# Patient Record
Sex: Female | Born: 1970
Health system: Southern US, Community
[De-identification: ages and names within clinical notes are randomized; demographics above are authoritative.]

## PROBLEM LIST (undated history)

## (undated) DIAGNOSIS — E509 Vitamin A deficiency, unspecified: Secondary | ICD-10-CM

## (undated) DIAGNOSIS — N7011 Chronic salpingitis: Secondary | ICD-10-CM

## (undated) HISTORY — PX: NO PAST SURGERIES: SHX2092

---

## 2014-04-01 DIAGNOSIS — D649 Anemia, unspecified: Secondary | ICD-10-CM

## 2014-04-01 HISTORY — DX: Anemia, unspecified: D64.9

## 2017-04-25 ENCOUNTER — Encounter: Payer: Self-pay | Admitting: Obstetrics & Gynecology

## 2017-04-25 ENCOUNTER — Ambulatory Visit: Payer: BLUE CROSS/BLUE SHIELD | Admitting: Obstetrics & Gynecology

## 2017-04-25 VITALS — BP 124/82 | Ht 67.25 in | Wt 135.0 lb

## 2017-04-25 DIAGNOSIS — Z3009 Encounter for other general counseling and advice on contraception: Secondary | ICD-10-CM | POA: Diagnosis not present

## 2017-04-25 DIAGNOSIS — Z1151 Encounter for screening for human papillomavirus (HPV): Secondary | ICD-10-CM

## 2017-04-25 DIAGNOSIS — Z01419 Encounter for gynecological examination (general) (routine) without abnormal findings: Secondary | ICD-10-CM | POA: Diagnosis not present

## 2017-04-25 DIAGNOSIS — R87610 Atypical squamous cells of undetermined significance on cytologic smear of cervix (ASC-US): Secondary | ICD-10-CM | POA: Diagnosis not present

## 2017-04-25 DIAGNOSIS — R7309 Other abnormal glucose: Secondary | ICD-10-CM | POA: Diagnosis not present

## 2017-04-25 NOTE — Progress Notes (Signed)
Allison Wood 05-22-1970 [id number]   History:    47 y.o. G1P1L1 Married.  Son is 76 yo, doing well.  Arrived from [place] 7 months ago.  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  RP: New patient preseting for annual gyn exam   HPI: Menses normal every 21 days.  No pelvic pain.  Normal vaginal secretions.  History of left tubal obstruction associated with endometriosis.  Unlikely to conceive, declines contraception, but okay if does conceive.  Breasts normal.  Last screening mammogram 2017.  Would like to do health labs here.  Past medical history,surgical history, family history and social history were all reviewed and documented in the EPIC chart.  Gynecologic History Patient's last menstrual period was 04/19/2017. Contraception: Declines contraception.  Left tubal obstruction/h/o Endometriosis.  [place] if conceives. Last Pap: 2017. Results were: normal Last mammogram: 2017. Results were: normal  Obstetric History OB History  Gravida Para Term Preterm AB Living  _0 SAB TAB Ectopic Multiple Live Births               # Outcome Date GA Lbr Len/2nd Weight Sex Delivery Anes PTL Lv  1 Para                ROS: A ROS was performed and pertinent positives and negatives are included in the history.  GENERAL: No fevers or chills. HEENT: No change in vision, no earache, sore throat or sinus congestion. NECK: No pain or stiffness. CARDIOVASCULAR: No chest pain or pressure. No palpitations. PULMONARY: No shortness of breath, cough or wheeze. GASTROINTESTINAL: No abdominal pain, nausea, vomiting or diarrhea, melena or bright red blood per rectum. GENITOURINARY: No urinary frequency, urgency, hesitancy or dysuria. MUSCULOSKELETAL: No joint or muscle pain, no back pain, no recent trauma. DERMATOLOGIC: No rash, no itching, no lesions. ENDOCRINE: No polyuria, polydipsia, no heat or cold intolerance. No recent change in weight. HEMATOLOGICAL: No anemia or easy bruising or  bleeding. NEUROLOGIC: No headache, seizures, numbness, tingling or weakness. PSYCHIATRIC: No depression, no loss of interest in normal activity or change in sleep pattern.     Exam:   BP 124/82   Ht 5' 7.25" (1.708 m)   Wt 135 lb (61.2 kg)   LMP 04/19/2017   BMI 20.99 kg/m   Body mass index is 20.99 kg/m.  General appearance : Well developed well nourished female. No acute distress HEENT: Eyes: no retinal hemorrhage or exudates,  Neck supple, trachea midline, no carotid bruits, no thyroidmegaly Lungs: Clear to auscultation, no rhonchi or wheezes, or rib retractions  Heart: Regular rate and rhythm, no murmurs or gallops Breast:Examined in sitting and supine position were symmetrical in appearance, no palpable masses or tenderness,  no skin retraction, no nipple inversion, no nipple discharge, no skin discoloration, no axillary or supraclavicular lymphadenopathy Abdomen: no palpable masses or tenderness, no rebound or guarding Extremities: no edema or skin discoloration or tenderness  Pelvic: Vulva normal  Bartholin, Urethra, Skene Glands: Within normal limits             Vagina: No gross lesions or discharge  Cervix: No gross lesions or discharge.  Pap/HR HPV done.  Uterus  AV, normal size, shape and consistency, non-tender and mobile  Adnexa  Without masses or tenderness  Anus and perineum  normal    Assessment/Plan:  47 y.o. female for annual exam   1. Encounter for routine gynecological examination with Papanicolaou smear of cervix Normal  gynecologic exam.  Pap test with high risk HPV done today.  Breast exam normal.  Will schedule screening mammogram.  Follow-up fasting health labs.  Recommend regular aerobic physical activity 5 times a week and weightlifting every 2 days. - CBC - TSH - Vitamin D 1,25 dihydroxy - Comp Met (CMET); Future - Lipid panel; Future - PAP,TP IMGw/HPV RNA,rflx HPVTYPE16,18/45  2. Encounter for other general counseling or advice on  contraception History of left tubal obstruction associated with pelvic endometriosis.  Unlikely to conceive and declines contraception.  At this point if patient conceives will be happy to pursue pregnancy.  3. Special screening examination for human papillomavirus (HPV)  - PAP,TP IMGw/HPV RNA,rflx [id number]/56  [name] MD, 10:57 AM 04/25/2017

## 2017-04-28 ENCOUNTER — Other Ambulatory Visit: Payer: BLUE CROSS/BLUE SHIELD

## 2017-04-28 ENCOUNTER — Encounter: Payer: Self-pay | Admitting: Obstetrics & Gynecology

## 2017-04-28 DIAGNOSIS — Z01419 Encounter for gynecological examination (general) (routine) without abnormal findings: Secondary | ICD-10-CM | POA: Diagnosis not present

## 2017-04-28 DIAGNOSIS — J019 Acute sinusitis, unspecified: Secondary | ICD-10-CM | POA: Diagnosis not present

## 2017-04-28 LAB — LIPID PANEL
CHOLESTEROL: 172 mg/dL (ref <200)
HDL: 61 mg/dL (ref >50)
LDL Cholesterol (Calc): 100 mg/dL (calc) — ABNORMAL HIGH
NON-HDL CHOLESTEROL (CALC): 111 mg/dL (ref <130)
TRIGLYCERIDES: 37 mg/dL (ref <150)
Total CHOL/HDL Ratio: 2.8 (calc) (ref <5.0)

## 2017-04-28 LAB — COMPREHENSIVE METABOLIC PANEL
AG RATIO: 1.5 (calc) (ref 1.0–2.5)
ALKALINE PHOSPHATASE (APISO): 74 U/L (ref 33–115)
ALT: 12 U/L (ref 6–29)
AST: 19 U/L (ref 10–35)
Albumin: 3.9 g/dL (ref 3.6–5.1)
BILIRUBIN TOTAL: 0.4 mg/dL (ref 0.2–1.2)
BUN: 15 mg/dL (ref 7–25)
CALCIUM: 8.5 mg/dL — AB (ref 8.6–10.2)
CHLORIDE: 107 mmol/L (ref 98–110)
CO2: 29 mmol/L (ref 20–32)
Creat: 0.78 mg/dL (ref 0.50–1.10)
GLOBULIN: 2.6 g/dL (ref 1.9–3.7)
Glucose, Bld: 104 mg/dL — ABNORMAL HIGH (ref 65–99)
POTASSIUM: 4 mmol/L (ref 3.5–5.3)
Sodium: 141 mmol/L (ref 135–146)
Total Protein: 6.5 g/dL (ref 6.1–8.1)

## 2017-04-28 NOTE — Patient Instructions (Signed)
1. Encounter for routine gynecological examination with Papanicolaou smear of cervix Normal gynecologic exam.  Pap test with high risk HPV done today.  Breast exam normal.  Will schedule screening mammogram.  Follow-up fasting health labs.  Recommend regular aerobic physical activity 5 times a week and weightlifting every 2 days. - CBC - TSH - Vitamin D 1,25 dihydroxy - Comp Met (CMET); Future - Lipid panel; Future - PAP,TP IMGw/HPV RNA,rflx HPVTYPE16,18/45  2. Encounter for other general counseling or advice on contraception History of left tubal obstruction associated with pelvic endometriosis.  Unlikely to conceive and declines contraception.  At this point if patient conceives will be happy to pursue pregnancy.  3. Special screening examination for human papillomavirus (HPV)  - PAP,TP IMGw/HPV RNA,rflx [id number]/67  Sherie, fue un placer conocerle hoy!  Voy a informarle de sus [employer].    [place]iu (Health Maintenance, Female) Un estilo de vida saludable y los cuidados preventivos pueden favorecer considerablemente a la salud y Musician. Pregunte a su mdico cul es el cronograma de exmenes peridicos apropiado para usted. Esta es una buena oportunidad para consultarlo sobre cmo prevenir enfermedades y [place] sano. Adems de los controles, hay muchas otras cosas que puede hacer usted mismo. Los expertos han realizado numerosas investigaciones [employer] cambios en el estilo de vida y las medidas de prevencin que, [place], lo ayudarn a mantenerse sano. Solicite a su mdico ms informacin. EL PESO Y LA DIETA Consuma una dieta saludable.  Asegrese de [employer] verduras, frutas, productos lcteos de bajo contenido de [place] y Advertising account planner.  No consuma muchos alimentos de alto contenido de grasas slidas, azcares agregados o sal.  Realice actividad fsica con regularidad. Esta es una de las prcticas ms  importantes que puede hacer por su salud. ? La Delorise Shiner de los adultos deben hacer ejercicio durante al menos 162mnutos por semana. El ejercicio debe aumentar la frecuencia cardaca y pActorla transpiracin (ejercicio de i[place]. ? La mayora de los adultos tambin deben hacer ejercicios de elongacin al mToysRusveces a la semana. Agregue esto al su plan de ejercicio de intensidad moderada. Mantenga un peso saludable.  El ndice de masa corporal ([facility] es una medida que puede utilizarse para identificar posibles problemas de pFour [address] Proporciona una estimacin de la grasa corporal basndose en el peso y la altura. Su mdico puede ayudarle a dRadiation protection practitionerIBrisbaney a lScientist, forensico mTheatre managerun peso saludable.  Para las mujeres de 20aos o ms: ? Un IStamford Memorial Hospitalmenor de 18,5 se considera bajo peso. ? Un IKessler Institute For Rehabilitation - Chesterentre 18,5 y 24,9 es normal. ? Un ISoutheasthealth Center Of Stoddard Countyentre 25 y 29,9 se considera sobrepeso. ? Un IMC de 30 o ms se considera obesidad. Observe los niveles de colesterol y lpidos en la sangre.  Debe comenzar a rEnglish as a second language teacherde lpidos y cResearch officer, trade unionen la sangre a los 20aos y luego repetirlos cada 556aos  Es posible que nAutomotive engineerlos niveles de colesterol con mayor frecuencia si: ? Sus niveles de lpidos y colesterol son altos. ? Es mayor de 567MCN ? Presenta un alto riesgo de padecer enfermedades cardacas. DETECCIN DE CNCER Cncer de pulmn  Se recomienda realizar exmenes de deteccin de cncer de pulmn a personas adultas entre 544y 858aos que estn en riesgo de dHorticulturist, commercialde pulmn por sus antecedentes de consumo de tabaco.  Se recomienda una tomografa computarizada de baja dosis de los pulmones todos los aos a las personas que: ?  Fuman actualmente. ? Hayan dejado el hbito en algn momento en los ltimos 15aos. ? Hayan fumado durante 30aos un paquete diario. Un paquete-ao equivale a fumar un promedio de un paquete de cigarrillos diario durante un ao.  Los  exmenes de deteccin anuales deben continuar hasta que hayan pasado 15aos desde que dej de fumar.  Ya no debern realizarse si tiene un problema de salud que le impida recibir tratamiento para Science writer de pulmn. Cncer de mama  Practique la autoconciencia de la mama. Esto significa reconocer la apariencia normal de sus mamas y cmo las siente.  Tambin significa realizar autoexmenes regulares de [employer]. Informe a su mdico sobre cualquier cambio, sin importar cun pequeo sea.  Si tiene entre 20 y 40 aos, un mdico debe realizarle un examen clnico de las mamas como parte del examen regular de [place], cada 1 a 3aos.  Si tiene 40aos o ms, debe Information systems manager clnico de las Microsoft. Tambin considere realizarse una [place]) todos los [place].  Si tiene antecedentes familiares de cncer de mama, hable con su mdico para someterse a un estudio gentico.  Si tiene alto riesgo de Chief Financial Officer de mama, hable con su mdico para someterse a Public house manager y [employer].  La evaluacin del gen del cncer de mama (BRCA) se recomienda a mujeres que tengan familiares con cnceres relacionados con el BRCA. Los cnceres relacionados con el BRCA incluyen los siguientes: ? [place]. ? Ovario. ? [place]. ? Cnceres de peritoneo.  Los resultados de la evaluacin determinarn la necesidad de asesoramiento gentico y de [place] de BRCA1 y BRCA2. Cncer de cuello del tero El mdico puede recomendarle que se haga pruebas peridicas de deteccin de cncer de los rganos de la pelvis (ovarios, tero y vagina). Estas pruebas incluyen un examen plvico, que abarca controlar si se produjeron cambios microscpicos en la superficie del cuello del tero (prueba de Papanicolaou). Pueden recomendarle que se haga estas pruebas cada 3aos, a partir de los 21aos.  A las mujeres que tienen entre 30 y 45aos, los mdicos pueden  recomendarles que se sometan a exmenes plvicos y pruebas de Papanicolaou cada 61aos, o a la prueba de Papanicolaou y el examen plvico en combinacin con estudios de deteccin del virus del papiloma humano (VPH) cada 5aos. Algunos tipos de VPH aumentan el riesgo de Chief Financial Officer de cuello del tero. La prueba para la deteccin del VPH tambin puede realizarse a mujeres de cualquier edad cuyos resultados de la prueba de Papanicolaou no sean claros.  Es posible que otros mdicos no recomienden exmenes de deteccin a mujeres no embarazadas que se consideran sujetos de bajo riesgo de Chief Financial Officer de pelvis y que no tienen sntomas. Pregntele al mdico si un examen plvico de deteccin es adecuado para usted.  Si ha recibido un tratamiento para Science writer cervical o una enfermedad que podra causar cncer, necesitar realizarse una prueba de Papanicolaou y controles durante al menos 70 aos de concluido el [place]. Si no se ha hecho el Papanicolaou con regularidad, debern volver a evaluarse los factores de riesgo (como tener un nuevo compaero sexual), para Teacher, adult education si debe realizarse los estudios nuevamente. Algunas mujeres sufren problemas mdicos que aumentan la probabilidad de Museum/gallery curator cncer de cuello del tero. En estos casos, el mdico podr [employer] se realicen controles y pruebas de Papanicolaou con ms frecuencia. Cncer colorrectal  Este tipo de cncer puede detectarse y a menudo prevenirse.  Por lo general, los estudios de rutina se deben Medical laboratory scientific officer a Field seismologist a Proofreader de los 13 aos y [place] 78 aos.  Sin embargo, el mdico podr aconsejarle que lo haga antes, si tiene factores de riesgo para el cncer de colon.  Tambin puede recomendarle que use un kit de prueba para Hydrologist en la materia fecal.  Es posible que se use una pequea cmara en el extremo de un tubo para examinar directamente el colon (sigmoidoscopia o colonoscopia) a fin de Hydrographic surveyor formas tempranas  de cncer colorrectal.  Los exmenes de rutina generalmente comienzan a los 26aos.  El examen directo del colon se debe repetir cada 5 a 10aos hasta los 75aos. Sin embargo, es posible que se realicen exmenes con mayor frecuencia, si se detectan formas tempranas de plipos precancerosos o pequeos bultos. Cncer de piel  Revise la piel de la cabeza a los pies con regularidad.  Informe a su mdico si aparecen nuevos lunares o los que tiene se modifican, especialmente en su forma y color.  Tambin notifique al mdico si tiene un lunar que es ms grande que el tamao de una goma de lpiz.  Siempre use pantalla solar. Aplique pantalla solar de [place] y repetida a lo largo del Training and development officer.  Protjase usando mangas y [employer], un sombrero de ala ancha y gafas para el sol, siempre que se encuentre en el exterior. ENFERMEDADES CARDACAS, DIABETES E HIPERTENSIN ARTERIAL  La hipertensin arterial causa enfermedades cardacas y [place] el riesgo de ictus. La hipertensin arterial es ms probable en los siguientes casos: ? Las personas que tienen la presin arterial en el extremo del rango normal (100-139/85-89 mm Hg). ? [employer] con sobrepeso u obesidad. ? Scientist, water quality.  Si usted tiene entre 18 y 39 aos, debe medirse la presin arterial cada 3 a 5 aos. Si usted tiene 40 aos o ms, debe medirse la presin arterial [employer]. Debe medirse la presin arterial dos veces: una vez cuando est en un hospital o una clnica y la otra vez cuando est en otro sitio. Registre el promedio de [employer]. Para controlar su presin arterial cuando no est en un hospital o [place], puede usar lo siguiente: ? Jorje Guild automtica para medir la presin arterial en una farmacia. ? Un monitor para medir la presin arterial en el hogar.  Si tiene entre 35 y 45 aos, consulte a su mdico si debe tomar aspirina para prevenir el ictus.  Realcese exmenes de deteccin  de la diabetes con regularidad. Esto incluye la toma de [place] de sangre para controlar el nivel de azcar en la sangre durante el [place]. ? Si tiene un peso normal y un bajo riesgo de padecer diabetes, realcese este anlisis cada tres aos despus de los 45aos. ? Si tiene sobrepeso y un alto riesgo de padecer diabetes, considere someterse a este anlisis antes o con mayor frecuencia. PREVENCIN DE INFECCIONES HepatitisB  Si tiene un riesgo ms alto de Museum/gallery curator hepatitis B, debe someterse a un examen de deteccin de este virus. Se considera que tiene un alto riesgo de contraer hepatitis B si: ? Naci en un pas donde la hepatitis B es frecuente. Pregntele a su mdico qu pases son considerados de Public affairs consultant. ? Sus padres nacieron en un pas de alto riesgo y usted no recibi una vacuna que lo proteja contra la hepatitis B (vacuna contra la hepatitis B). ? [place]. ? [place] agujas para inyectarse  drogas. ? Vive con alguien que tiene hepatitis B. ? Ha tenido sexo con alguien que tiene hepatitis B. ? Recibe tratamiento de hemodilisis. ? Toma ciertos medicamentos para el cncer, trasplante de rganos y afecciones autoinmunitarias. Hepatitis C  Se recomienda un anlisis de [place] para: ? [employer] 1945 y 1965. ? Todas las personas que tengan un riesgo de haber contrado hepatitis C. Enfermedades de transmisin sexual (ETS).  Debe realizarse pruebas de deteccin de enfermedades de transmisin sexual (ETS), incluidas gonorrea y clamidia si: ? Es sexualmente activo y es menor de 09OBS. ? Es mayor de 24aos, y Investment banker, operational informa que corre riesgo de tener este tipo de infecciones. ? La actividad sexual ha cambiado desde que le hicieron la ltima prueba de deteccin y tiene un riesgo mayor de Best boy clamidia o Radio broadcast assistant. Pregntele al mdico si usted tiene riesgo.  Si no tiene el VIH, pero corre riesgo de infectarse por el virus, se recomienda tomar diariamente  un medicamento recetado para evitar la infeccin. Esto se conoce como profilaxis previa a la exposicin. Se considera que est en riesgo si: ? Es [place] sexualmente y no [place] preservativos habitualmente o no conoce el estado del VIH de sus Advertising copywriter. ? Se inyecta drogas. ? Es [place] sexualmente con Ardelia Mems pareja que tiene VIH. Consulte a su mdico para saber si tiene un alto riesgo de infectarse por el VIH. Si opta por comenzar la profilaxis previa a la exposicin, primero debe realizarse anlisis de deteccin del VIH. Luego, le harn anlisis cada 47mses mientras est tomando los medicamentos para la profilaxis previa a la exposicin. [facility] Si es premenopusica y puede quedar e[place]nels[place] solicite a su mdico asesoramiento previo a la concepcin.  Si puede quedar embarazada, tome 400 a [id number](mcg) de cido fAnheuser-Busch  Si desea evitar el embarazo, hable con su mdico sobre el control de la natalidad (anticoncepcin). OSTEOPOROSIS Y MENOPAUSIA  La osteoporosis es una enfermedad en la que los huesos pierden los minerales y la fuerza por el avance de la edad. El resultado pueden ser fracturas graves en los hToaville El riesgo de osteoporosis puede identificarse con uArdelia Memsprueba de densidad sea.  Si tiene 65aos o ms, o si est en riesgo de sufrir osteoporosis y fracturas, pregunte a su mdico si debe someterse a exmenes.  Consulte a su mdico si debe tomar un suplemento de calcio o de vitamina D para reducir el riesgo de osteoporosis.  La menopausia puede presentar ciertos sntomas fsicos y rGaffer  La terapia de reemplazo hormonal puede reducir algunos de estos sntomas y rGaffer Consulte a su mdico para saber si la terapia de reemplazo hormonal es conveniente para usted. INSTRUCCIONES PARA EL CUIDADO EN EL HOGAR  Realcese los estudios de rutina de la salud, dentales y de lPublic librarian  [place]  No consuma ningn producto que  contenga tabaco, lo que incluye cigarrillos, tabaco de mHigher education careers advisero cPsychologist, sport and exercise  Si est embarazada, no beba alcohol.  Si est amamantando, reduzca el consumo de alcohol y la frecuencia con la que consume.  Si es mujer y no est embarazada limite el consumo de alcohol a no ms de 1 medida por da. Una medida equivale a 12onzas de cerveza, 5onzas de vino o 1onzas de bebidas alcohlicas de alta graduacin.  No consuma drogas.  No comparta agujas.  Solicite ayuda a su mdico si necesita apoyo o informacin para abandonar las drogas.  Informe a su  mdico si a menudo se siente deprimido.  Notifique a su mdico si alguna vez ha sido vctima de abuso o si no se siente seguro en su hogar. Esta informacin no tiene Marine scientist el consejo del mdico. Asegrese de hacerle al mdico cualquier pregunta que tenga. Document Released: 03/07/2011 Document Revised: 04/08/2014 Document Reviewed: 12/20/2014 Elsevier Interactive Patient Education  [employer].

## 2017-04-30 LAB — PAP, TP IMAGING W/ HPV RNA, RFLX HPV TYPE 16,18/45: HPV DNA HIGH RISK: NOT DETECTED

## 2017-05-01 LAB — CBC
HCT: 41.1 % (ref 35.0–45.0)
HEMOGLOBIN: 13.5 g/dL (ref 11.7–15.5)
MCH: 27.5 pg (ref 27.0–33.0)
MCHC: 32.8 g/dL (ref 32.0–36.0)
MCV: 83.7 fL (ref 80.0–100.0)
MPV: 10.5 fL (ref 7.5–12.5)
Platelets: 249 10*3/uL (ref 140–400)
RBC: 4.91 10*6/uL (ref 3.80–5.10)
RDW: 12.8 % (ref 11.0–15.0)
WBC: 8.2 10*3/uL (ref 3.8–10.8)

## 2017-05-01 LAB — TEST AUTHORIZATION

## 2017-05-01 LAB — VITAMIN D 1,25 DIHYDROXY
Vitamin D 1, 25 (OH)2 Total: 60 pg/mL (ref 18–72)
Vitamin D3 1, 25 (OH)2: 60 pg/mL

## 2017-05-01 LAB — HEMOGLOBIN A1C
HEMOGLOBIN A1C: 5.4 %{Hb} (ref <5.7)
Mean Plasma Glucose: 108 (calc)
eAG (mmol/L): 6 (calc)

## 2017-05-01 LAB — TSH: TSH: 2.26 m[IU]/L

## 2017-05-20 DIAGNOSIS — B349 Viral infection, unspecified: Secondary | ICD-10-CM | POA: Diagnosis not present

## 2019-10-26 ENCOUNTER — Other Ambulatory Visit: Payer: Self-pay

## 2019-10-26 ENCOUNTER — Encounter: Payer: Self-pay | Admitting: Obstetrics & Gynecology

## 2019-10-26 ENCOUNTER — Ambulatory Visit: Payer: BC Managed Care – PPO | Admitting: Obstetrics & Gynecology

## 2019-10-26 VITALS — BP 118/70 | Ht 67.0 in | Wt 133.0 lb

## 2019-10-26 DIAGNOSIS — N841 Polyp of cervix uteri: Secondary | ICD-10-CM | POA: Diagnosis not present

## 2019-10-26 DIAGNOSIS — Z3009 Encounter for other general counseling and advice on contraception: Secondary | ICD-10-CM

## 2019-10-26 DIAGNOSIS — Z01419 Encounter for gynecological examination (general) (routine) without abnormal findings: Secondary | ICD-10-CM | POA: Diagnosis not present

## 2019-10-26 DIAGNOSIS — N921 Excessive and frequent menstruation with irregular cycle: Secondary | ICD-10-CM | POA: Diagnosis not present

## 2019-10-26 NOTE — Progress Notes (Signed)
Allison Wood [birth year] [id number]   History:    49 y.o. G1P1L1 Married.  Son is 48 yo, doing well.  From [place].  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  RP: Established patient preseting for annual gyn exam   HPI: Menses normal every month.  Had BTB every day this month, since LMP.  No pelvic pain.  Normal vaginal secretions.  History of left tubal obstruction associated with endometriosis.  Unlikely to conceive, declines contraception, but okay if does conceive.  Breasts normal.  Last screening mammogram 2017, will schedule now. Fasting Health Labs with Fam MD.   Past medical history,surgical history, family history and social history were all reviewed and documented in the EPIC chart.  Gynecologic History Patient's last menstrual period was 10/01/2019.  Obstetric History OB History  Gravida Para Term Preterm AB Living  1 1       1   SAB TAB Ectopic Multiple Live Births               # Outcome Date GA Lbr Len/2nd Weight Sex Delivery Anes PTL Lv  1 Para              ROS: A ROS was performed and pertinent positives and negatives are included in the history.  GENERAL: No fevers or chills. HEENT: No change in vision, no earache, sore throat or sinus congestion. NECK: No pain or stiffness. CARDIOVASCULAR: No chest pain or pressure. No palpitations. PULMONARY: No shortness of breath, cough or wheeze. GASTROINTESTINAL: No abdominal pain, nausea, vomiting or diarrhea, melena or bright red blood per rectum. GENITOURINARY: No urinary frequency, urgency, hesitancy or dysuria. MUSCULOSKELETAL: No joint or muscle pain, no back pain, no recent trauma. DERMATOLOGIC: No rash, no itching, no lesions. ENDOCRINE: No polyuria, polydipsia, no heat or cold intolerance. No recent change in weight. HEMATOLOGICAL: No anemia or easy bruising or bleeding. NEUROLOGIC: No headache, seizures, numbness, tingling or weakness. PSYCHIATRIC: No depression, no loss of interest in normal activity  or change in sleep pattern.     Exam:   BP 118/70    Ht 5\' 7"  (1.702 m)    Wt 133 lb (60.3 kg)    LMP 10/01/2019    BMI 20.83 kg/m   Body mass index is 20.83 kg/m.  General appearance : Well developed well nourished female. No acute distress HEENT: Eyes: no retinal hemorrhage or exudates,  Neck supple, trachea midline, no carotid bruits, no thyroidmegaly Lungs: Clear to auscultation, no rhonchi or wheezes, or rib retractions  Heart: Regular rate and rhythm, no murmurs or gallops Breast:Examined in sitting and supine position were symmetrical in appearance, no palpable masses or tenderness,  no skin retraction, no nipple inversion, no nipple discharge, no skin discoloration, no axillary or supraclavicular lymphadenopathy Abdomen: no palpable masses or tenderness, no rebound or guarding Extremities: no edema or skin discoloration or tenderness  Pelvic: Vulva: Normal             Vagina: No gross lesions or discharge  Cervix: Polyp at the External Os.  Pap test done.  Betadine prep. Informed consent obtained. Removal of Polyp with a clamp.  Silver Nitrate on base for hemostasis.  Sent to patho.  Uterus  AV, normal size, shape and consistency, non-tender and mobile  Adnexa  Without masses or tenderness  Anus: Normal   Assessment/Plan:  49 y.o. female for annual exam   1. Encounter for routine gynecological examination with Papanicolaou smear of cervix Normal gynecologic exam.  Pap reflex  done.  Breast exam normal.  Needs to schedule a screening mammogram now.  Health labs with family physician.  Good body mass index at 20.83.  Continue with fitness and healthy nutrition.  2. Encounter for other general counseling or advice on contraception H/O Lt tubal obstruction/Endometriosis.  Declines contraception.  3. Metrorrhagia Metrorrhagia probably due to endocervical polyp removed today.  Rule out endometrial pathology.  Follow-up pelvic ultrasound. - US Transvaginal Non-OB; Future  4.  Endocervical polyp Endocervical polyp removed without difficulty.  Specimen sent to pathology.  Silver nitrate applied to the base.  Good hemostasis.- Pathology Report ([facility])  Other orders - Vitamin D, Ergocalciferol, (DRISDOL) 1.25 MG (50000 UNIT) CAPS capsule; Take 50,000 Units by mouth every 7 (seven) days.  [name] MD, 3:36 PM 10/26/2019

## 2019-10-28 ENCOUNTER — Other Ambulatory Visit: Payer: Self-pay | Admitting: Obstetrics & Gynecology

## 2019-10-28 ENCOUNTER — Encounter: Payer: Self-pay | Admitting: Obstetrics & Gynecology

## 2019-10-28 DIAGNOSIS — Z1231 Encounter for screening mammogram for malignant neoplasm of breast: Secondary | ICD-10-CM

## 2019-10-28 LAB — PATHOLOGY REPORT

## 2019-10-28 LAB — TISSUE SPECIMEN

## 2019-11-02 LAB — PAP IG W/ RFLX HPV ASCU

## 2019-11-02 LAB — HUMAN PAPILLOMAVIRUS, HIGH RISK: HPV DNA High Risk: NOT DETECTED

## 2019-11-10 ENCOUNTER — Ambulatory Visit: Payer: BC Managed Care – PPO

## 2019-11-11 ENCOUNTER — Ambulatory Visit (INDEPENDENT_AMBULATORY_CARE_PROVIDER_SITE_OTHER): Payer: BC Managed Care – PPO | Admitting: Obstetrics & Gynecology

## 2019-11-11 ENCOUNTER — Ambulatory Visit (INDEPENDENT_AMBULATORY_CARE_PROVIDER_SITE_OTHER): Payer: BC Managed Care – PPO

## 2019-11-11 ENCOUNTER — Other Ambulatory Visit: Payer: Self-pay

## 2019-11-11 DIAGNOSIS — R9389 Abnormal findings on diagnostic imaging of other specified body structures: Secondary | ICD-10-CM | POA: Diagnosis not present

## 2019-11-11 DIAGNOSIS — D251 Intramural leiomyoma of uterus: Secondary | ICD-10-CM | POA: Diagnosis not present

## 2019-11-11 DIAGNOSIS — N921 Excessive and frequent menstruation with irregular cycle: Secondary | ICD-10-CM

## 2019-11-11 DIAGNOSIS — N7011 Chronic salpingitis: Secondary | ICD-10-CM

## 2019-11-11 DIAGNOSIS — N840 Polyp of corpus uteri: Secondary | ICD-10-CM

## 2019-11-11 DIAGNOSIS — N854 Malposition of uterus: Secondary | ICD-10-CM | POA: Diagnosis not present

## 2019-11-11 NOTE — Progress Notes (Signed)
    Allison Wood 09/09/1970 [id number]        49 y.o.  G1P1L1 Married  RP: Persistent metrorrhagia for pelvic US  HPI: BTB most days, even after removal of Endocervical Polyp, patho benign.  H/O secondary infertility, HSG Lt tube distal obstruction.  No pelvic pain.   OB History  Gravida Para Term Preterm AB Living  1 1       1   SAB TAB Ectopic Multiple Live Births               # Outcome Date GA Lbr Len/2nd Weight Sex Delivery Anes PTL Lv  1 Para             Past medical history,surgical history, problem list, medications, allergies, family history and social history were all reviewed and documented in the EPIC chart.   Directed ROS with pertinent positives and negatives documented in the history of present illness/assessment and plan.  Exam:  There were no vitals filed for this visit. General appearance:  Normal  Pelvic US today: T/V and T/A images.  Anteverted uterus normal in size and shape with 2 small intramural fibroids.  The overall uterine size is measured at 7.83 x 5.65 x 4.25 cm.  Fluid within the endometrial cavity outlining a 5 mm intracavitary mass towards the right cornua compatible with an endometrial polyp.  The right ovary is small with atrophic appearance.  Large serpiginous mass arising from the left adnexa into the cul-de-sac measuring 12.2 x 9.4 cm compatible with a left hydrosalpinx.  Not able to definitely identify the left ovary separate from the large mass.  No free fluid in the posterior cul-de-sac.   Assessment/Plan:  49 y.o. G1P1   1. Metrorrhagia Persistent metrorrhagia with probable endometrial polyp measured at 5 mm.  Decision to proceed with hysteroscopy with MyoSure excision and D&C.  Pamphlet given.  Information discussed and patient will follow up for a preop visit.  2. Endometrial polyp/Fibroids Schedule HSC/Myosure Excision/D+C  3. Hydrosalpinx Left hydrosalpinx is present on pelvic ultrasound today.  It is quite large but the patient  is asymptomatic.  Known left tubal obstruction from previous hysterosalpingography in the context of secondary infertility.  Counseling done about hydrosalpinx.  Patient prefers observation at this time.   [name] MD, 8:47 AM 11/11/2019

## 2019-11-13 ENCOUNTER — Encounter: Payer: Self-pay | Admitting: Obstetrics & Gynecology

## 2019-11-19 ENCOUNTER — Telehealth: Payer: Self-pay

## 2019-11-19 ENCOUNTER — Encounter: Payer: Self-pay | Admitting: Obstetrics & Gynecology

## 2019-11-19 ENCOUNTER — Other Ambulatory Visit: Payer: Self-pay

## 2019-11-19 ENCOUNTER — Ambulatory Visit: Payer: BC Managed Care – PPO | Admitting: Obstetrics & Gynecology

## 2019-11-19 VITALS — BP 130/82

## 2019-11-19 DIAGNOSIS — N72 Inflammatory disease of cervix uteri: Secondary | ICD-10-CM

## 2019-11-19 DIAGNOSIS — R87610 Atypical squamous cells of undetermined significance on cytologic smear of cervix (ASC-US): Secondary | ICD-10-CM | POA: Diagnosis not present

## 2019-11-19 DIAGNOSIS — N7011 Chronic salpingitis: Secondary | ICD-10-CM

## 2019-11-19 DIAGNOSIS — N840 Polyp of corpus uteri: Secondary | ICD-10-CM | POA: Diagnosis not present

## 2019-11-19 NOTE — Telephone Encounter (Signed)
Allison Wood called patient on my behalf and discussed "Dr. Marguerita Merles sent me new surgery order. I recalculated her surgery prepayment and with her $1750 deductible not yet met and her 20% her prepymt amount is $1915.00 due by one week before surgery to [place]. I will mail a financial letter as well.   The only date I have left in Sept (I need two docs and twice as much time as before) is Wed. Sept 15 at 9:00am at [facility]. She will need to check in two hours early. I will mail a pamphlet from [facility].   Her Covid test will be on Saturday 9/11. I held her time at noon but she can go earlier. Just let me know if she wants to. They only are there until 12:30 on saturdays and that was the latest appointment available. I will mail her address and map to site.   Looks like her pre op appt already scheduled."  Allison Wood spoke with her about all this and patient would like the 9/15 surgery time. I will schedule for her and mail her a packet.

## 2019-11-19 NOTE — Progress Notes (Signed)
    Allison Wood May 14, 1970 [id number]        49 y.o.  G1P1L1  RP: Second ASCUS/HPV HR Negative for Colposcopy  HPI: ASCUS/HPV HR Negative in 2019 and 09/2019.  C/O Left Pelvic Pain.  Known Left large Hydrosalpinx, would like to remove it.   OB History  Gravida Para Term Preterm AB Living  1 1       1   SAB TAB Ectopic Multiple Live Births               # Outcome Date GA Lbr Len/2nd Weight Sex Delivery Anes PTL Lv  1 Para             Past medical history,surgical history, problem list, medications, allergies, family history and social history were all reviewed and documented in the EPIC chart.   Directed ROS with pertinent positives and negatives documented in the history of present illness/assessment and plan.  Exam:  Vitals:   11/19/19 1206  BP: 130/82   General appearance:  Normal  Colposcopy Procedure Note Allison Wood 11/19/2019  Indications:  ASCUS/HPV HR Negative x 2 for Colposcopy  Procedure Details  The risks and benefits of the procedure and Verbal informed consent obtained.  Speculum placed in vagina and excellent visualization of cervix achieved, cervix swabbed x 3 with acetic acid solution.  Findings:  Cervix colposcopy: Physical Exam Genitourinary:       Vaginal colposcopy: Normal  Vulvar colposcopy: Normal  Perirectal colposcopy: Normal  The cervix was sprayed with Hurricane before performing the cervical biopsies.  Specimens:  Cervical Bxs at 4-49-7 O'Clock  Complications:  None, Silver Nitrate for hemostasis . Plan:  Management per cervical Bx results   Assessment/Plan:  49 y.o. G1P1   1. ASCUS of cervix with negative high risk HPV ASCUS with negative high-risk HPV x2.  Colposcopy procedure explained.  Colposcopy findings reviewed with patient.  Cervical biopsies done.  Management per results.  Postprocedure precautions reviewed.  2. Hydrosalpinx Patient requests to proceed with removal of her Left Hydrosalpinx because of  Left Pelvic Pain.  Will add LPS Left Salpingectomy.  Therefore planning LPS Left Salpingectomy with HSC/Myosure Excision/D+C.  F/U Preop visit.  3. Endometrial polyp As above.  Allison Bruins MD, 12:10 PM 11/19/2019

## 2019-11-23 LAB — PATHOLOGY REPORT

## 2019-11-23 LAB — TISSUE PATH REPORT 10802

## 2019-11-25 ENCOUNTER — Ambulatory Visit
Admission: RE | Admit: 2019-11-25 | Discharge: 2019-11-25 | Disposition: A | Discharge status: Home / Self Care | Payer: BC Managed Care – PPO | Source: Ambulatory Visit | Attending: Obstetrics & Gynecology | Admitting: Obstetrics & Gynecology

## 2019-11-25 ENCOUNTER — Other Ambulatory Visit: Payer: Self-pay

## 2019-11-25 DIAGNOSIS — Z1231 Encounter for screening mammogram for malignant neoplasm of breast: Secondary | ICD-10-CM

## 2019-11-29 ENCOUNTER — Other Ambulatory Visit: Payer: Self-pay | Admitting: Obstetrics & Gynecology

## 2019-11-29 DIAGNOSIS — R928 Other abnormal and inconclusive findings on diagnostic imaging of breast: Secondary | ICD-10-CM

## 2019-12-01 ENCOUNTER — Other Ambulatory Visit: Payer: Self-pay

## 2019-12-01 ENCOUNTER — Ambulatory Visit: Payer: BC Managed Care – PPO | Admitting: Obstetrics & Gynecology

## 2019-12-01 ENCOUNTER — Encounter: Payer: Self-pay | Admitting: Obstetrics & Gynecology

## 2019-12-01 ENCOUNTER — Encounter: Payer: Self-pay | Admitting: Anesthesiology

## 2019-12-01 VITALS — BP 120/76

## 2019-12-01 DIAGNOSIS — N840 Polyp of corpus uteri: Secondary | ICD-10-CM

## 2019-12-01 DIAGNOSIS — R9389 Abnormal findings on diagnostic imaging of other specified body structures: Secondary | ICD-10-CM | POA: Diagnosis not present

## 2019-12-01 DIAGNOSIS — N7011 Chronic salpingitis: Secondary | ICD-10-CM

## 2019-12-01 NOTE — Progress Notes (Signed)
    Allison Wood 06/04/1970 [id number]        49 y.o.  G1P1L1  RP: Preop LPS Left Salpingectomy/HSC/Myosure Excision/D+C on 12/15/2019  HPI: Left pelvic pain with large Hydrosalpinx.  Also has a tendency for constipation.  Metrorrhagia with a probable Endometrial Polyp 5 mm.   OB History  Gravida Para Term Preterm AB Living  1 1       1   SAB TAB Ectopic Multiple Live Births               # Outcome Date GA Lbr Len/2nd Weight Sex Delivery Anes PTL Lv  1 Para             Past medical history,surgical history, problem list, medications, allergies, family history and social history were all reviewed and documented in the EPIC chart.   Directed ROS with pertinent positives and negatives documented in the history of present illness/assessment and plan.  Exam:  Vitals:   12/01/19 1239  BP: 120/76   General appearance:  Normal  Pelvic US 11/11/2019: T/V and T/Aimages.Anteverted uterus normal in size and shape with 2 small intramural fibroids. The overall uterine size is measured at 7.83 x 5.65 x 4.25 cm. Fluid within the endometrial cavity outlining a 5 mm intracavitary mass towards the right cornua compatible with an endometrial polyp. The right ovary is small with atrophic appearance. Large serpiginous mass arising from the left adnexa into the cul-de-sac measuring 12.2 x 9.4 cm compatible with a left hydrosalpinx. Not able to definitely identify the left ovary separate from the large mass. No free fluid in the posterior cul-de-sac.   Assessment/Plan:  49 y.o. G1P1   1. Hydrosalpinx Large left Hydrosalpinx with left pelvic pain.  Metrorrhagia with an IU lesion c/w a Polyp 5 mm.  Decision to proceed with LPS Left Salpingectomy/possible lysis of adhesions/ Hysteroscopy with Myosure excision and D+C.  Preop recommendations, surgical procedure and risks, as well as postop precautions and expectations thoroughly reviewed with patient.  Questions answered.  Patient voiced  understanding and agreement with plan.  Tendency for constipation.  Nutritional recommendations to soften her stools reviewed.    2. Endometrial polyp As above.                        Patient was counseled as to the risk of surgery to include the following:  1. Infection (prohylactic antibiotics will be administered)  2. DVT/Pulmonary Embolism (prophylactic pneumo compression stockings will be used)  3.Trauma to internal organs requiring additional surgical procedure to repair any injury to internal organs requiring perhaps additional hospitalization days.  4.Hemmorhage requiring transfusion and blood products which carry risks such as anaphylactic reaction, hepatitis and AIDS  Patient had received literature information on the procedure scheduled and all her questions were answered and fully accepts all risk.   [name] MD, 12:56 PM 12/01/2019

## 2019-12-09 ENCOUNTER — Other Ambulatory Visit: Payer: Self-pay

## 2019-12-09 ENCOUNTER — Encounter (HOSPITAL_BASED_OUTPATIENT_CLINIC_OR_DEPARTMENT_OTHER): Payer: Self-pay | Admitting: Obstetrics & Gynecology

## 2019-12-09 NOTE — Progress Notes (Signed)
Spoke w/ via phone for pre-op interview---pt with pacific interpreter ian # [phone] Lab needs dos---- cbc, urine preg, t & s              Lab results------none COVID test ------12-11-2019 at 1200 pm Arrive at -------700 am 12-15-2019 NPO after MN NO Solid Food.    Water from MN until---600 am then npo Medications to take morning of surgery -----none Diabetic medication -----n/a Patient Special Instructions -----none Pre-Op special Istructions -----none Patient verbalized understanding of instructions that were given at this phone interview. Patient denies shortness of breath, chest pain, fever, cough at this phone interview.  Spanish interpreter requested for day of surgery email confirmation placed on chart

## 2019-12-11 ENCOUNTER — Other Ambulatory Visit (HOSPITAL_COMMUNITY)
Admission: RE | Admit: 2019-12-11 | Discharge: 2019-12-11 | Disposition: A | Discharge status: Home / Self Care | Payer: BC Managed Care – PPO | Source: Ambulatory Visit | Attending: Obstetrics & Gynecology | Admitting: Obstetrics & Gynecology

## 2019-12-11 DIAGNOSIS — Z01812 Encounter for preprocedural laboratory examination: Secondary | ICD-10-CM | POA: Diagnosis not present

## 2019-12-11 DIAGNOSIS — Z20822 Contact with and (suspected) exposure to covid-19: Secondary | ICD-10-CM | POA: Insufficient documentation

## 2019-12-11 LAB — SARS CORONAVIRUS 2 (TAT 6-24 HRS): SARS Coronavirus 2: NEGATIVE

## 2019-12-13 ENCOUNTER — Other Ambulatory Visit: Payer: BC Managed Care – PPO

## 2019-12-14 NOTE — Anesthesia Preprocedure Evaluation (Addendum)
Anesthesia Evaluation  Patient identified by MRN, date of birth, ID band  Reviewed: Allergy & Precautions, NPO status , Patient's Chart, lab work & pertinent test results  Airway Mallampati: I  TM Distance: >3 FB Neck ROM: Full    Dental no notable dental hx.    Pulmonary neg pulmonary ROS,    Pulmonary exam normal breath sounds clear to auscultation       Cardiovascular Exercise Tolerance: Good negative cardio ROS Normal cardiovascular exam Rhythm:Regular Rate:Normal     Neuro/Psych negative neurological ROS  negative psych ROS   GI/Hepatic negative GI ROS, Neg liver ROS,   Endo/Other  negative endocrine ROS  Renal/GU negative Renal ROS  negative genitourinary   Musculoskeletal   Abdominal Normal abdominal exam  (+)   Peds  Hematology  (+) anemia ,   Anesthesia Other Findings hydrosalpinx  Reproductive/Obstetrics                            Anesthesia Physical Anesthesia Plan  ASA: I  Anesthesia Plan: General   Post-op Pain Management:    Induction:   PONV Risk Score and Plan: 3 and Midazolam, Dexamethasone, Ondansetron, Treatment may vary due to age or medical condition and Scopolamine patch - Pre-op  Airway Management Planned: Oral ETT  Additional Equipment:   Intra-op Plan:   Post-operative Plan: Extubation in OR  Informed Consent: I have reviewed the patients History and Physical, chart, labs and discussed the procedure including the risks, benefits and alternatives for the proposed anesthesia with the patient or authorized representative who has indicated his/her understanding and acceptance.     Interpreter used for [employer] and Engineer, production given  Plan Discussed with: Anesthesiologist, CRNA and Surgeon  Anesthesia Plan Comments:        Anesthesia Quick Evaluation

## 2019-12-15 ENCOUNTER — Ambulatory Visit (HOSPITAL_BASED_OUTPATIENT_CLINIC_OR_DEPARTMENT_OTHER): Payer: BC Managed Care – PPO | Admitting: Anesthesiology

## 2019-12-15 ENCOUNTER — Encounter (HOSPITAL_BASED_OUTPATIENT_CLINIC_OR_DEPARTMENT_OTHER)
Admission: RE | Disposition: A | Discharge status: Home / Self Care | Payer: Self-pay | Source: Home / Self Care | Attending: Obstetrics & Gynecology

## 2019-12-15 ENCOUNTER — Other Ambulatory Visit: Payer: Self-pay

## 2019-12-15 ENCOUNTER — Encounter (HOSPITAL_BASED_OUTPATIENT_CLINIC_OR_DEPARTMENT_OTHER): Payer: Self-pay | Admitting: Obstetrics & Gynecology

## 2019-12-15 ENCOUNTER — Ambulatory Visit (HOSPITAL_BASED_OUTPATIENT_CLINIC_OR_DEPARTMENT_OTHER)
Admission: RE | Admit: 2019-12-15 | Discharge: 2019-12-15 | Disposition: A | Discharge status: Home / Self Care | Payer: BC Managed Care – PPO | Attending: Obstetrics & Gynecology | Admitting: Obstetrics & Gynecology

## 2019-12-15 DIAGNOSIS — N802 Endometriosis of fallopian tube: Secondary | ICD-10-CM | POA: Diagnosis not present

## 2019-12-15 DIAGNOSIS — N840 Polyp of corpus uteri: Secondary | ICD-10-CM | POA: Diagnosis present

## 2019-12-15 DIAGNOSIS — N921 Excessive and frequent menstruation with irregular cycle: Secondary | ICD-10-CM | POA: Insufficient documentation

## 2019-12-15 DIAGNOSIS — N7011 Chronic salpingitis: Secondary | ICD-10-CM | POA: Diagnosis present

## 2019-12-15 DIAGNOSIS — N801 Endometriosis of ovary: Secondary | ICD-10-CM | POA: Diagnosis not present

## 2019-12-15 DIAGNOSIS — N83292 Other ovarian cyst, left side: Secondary | ICD-10-CM

## 2019-12-15 DIAGNOSIS — K660 Peritoneal adhesions (postprocedural) (postinfection): Secondary | ICD-10-CM | POA: Diagnosis not present

## 2019-12-15 DIAGNOSIS — N7091 Salpingitis, unspecified: Secondary | ICD-10-CM

## 2019-12-15 HISTORY — DX: Vitamin a deficiency, unspecified: E50.9

## 2019-12-15 HISTORY — DX: Chronic salpingitis: N70.11

## 2019-12-15 HISTORY — PX: LAPAROSCOPIC UNILATERAL SALPINGECTOMY: SHX5934

## 2019-12-15 LAB — ABO/RH: ABO/RH(D): A POS

## 2019-12-15 LAB — CBC
HCT: 35.6 % — ABNORMAL LOW (ref 36.0–46.0)
Hemoglobin: 11.2 g/dL — ABNORMAL LOW (ref 12.0–15.0)
MCH: 27.7 pg (ref 26.0–34.0)
MCHC: 31.5 g/dL (ref 30.0–36.0)
MCV: 87.9 fL (ref 80.0–100.0)
Platelets: 313 10*3/uL (ref 150–400)
RBC: 4.05 MIL/uL (ref 3.87–5.11)
RDW: 14.4 % (ref 11.5–15.5)
WBC: 5.8 10*3/uL (ref 4.0–10.5)
nRBC: 0 % (ref 0.0–0.2)

## 2019-12-15 LAB — TYPE AND SCREEN
ABO/RH(D): A POS
Antibody Screen: NEGATIVE

## 2019-12-15 LAB — POCT PREGNANCY, URINE: Preg Test, Ur: NEGATIVE

## 2019-12-15 SURGERY — SALPINGECTOMY, UNILATERAL, LAPAROSCOPIC
Anesthesia: General | Site: Abdomen

## 2019-12-15 MED ORDER — SILVER NITRATE-POT NITRATE 75-25 % EX MISC
CUTANEOUS | Status: DC | PRN
  Administered 2019-12-15: 3

## 2019-12-15 MED ORDER — BUPIVACAINE HCL (PF) 0.25 % IJ SOLN
INTRAMUSCULAR | Status: DC | PRN
  Administered 2019-12-15: 28 mL

## 2019-12-15 MED ORDER — DEXAMETHASONE SODIUM PHOSPHATE 10 MG/ML IJ SOLN
INTRAMUSCULAR | Status: AC
  Filled 2019-12-15: qty 1

## 2019-12-15 MED ORDER — SCOPOLAMINE 1 MG/3DAYS TD PT72
MEDICATED_PATCH | TRANSDERMAL | Status: AC
  Filled 2019-12-15: qty 1

## 2019-12-15 MED ORDER — ROCURONIUM BROMIDE 10 MG/ML (PF) SYRINGE
PREFILLED_SYRINGE | INTRAVENOUS | Status: DC | PRN
  Administered 2019-12-15: 60 mg via INTRAVENOUS

## 2019-12-15 MED ORDER — MIDAZOLAM HCL 2 MG/2ML IJ SOLN
INTRAMUSCULAR | Status: DC | PRN
  Administered 2019-12-15: 2 mg via INTRAVENOUS

## 2019-12-15 MED ORDER — FENTANYL CITRATE (PF) 100 MCG/2ML IJ SOLN
INTRAMUSCULAR | Status: DC | PRN
Start: 2019-12-15 — End: 2019-12-15
  Administered 2019-12-15 (×2): 50 ug via INTRAVENOUS

## 2019-12-15 MED ORDER — OXYCODONE-ACETAMINOPHEN 7.5-325 MG PO TABS
1.0000 | ORAL_TABLET | Freq: Four times a day (QID) | ORAL | 0 refills | Status: DC | PRN
Start: 2019-12-15 — End: 2020-01-13

## 2019-12-15 MED ORDER — ACETAMINOPHEN 500 MG PO TABS
ORAL_TABLET | ORAL | Status: AC
  Filled 2019-12-15: qty 2

## 2019-12-15 MED ORDER — LIDOCAINE 2% (20 MG/ML) 5 ML SYRINGE
INTRAMUSCULAR | Status: DC | PRN
  Administered 2019-12-15: 80 mg via INTRAVENOUS

## 2019-12-15 MED ORDER — ONDANSETRON HCL 4 MG/2ML IJ SOLN
INTRAMUSCULAR | Status: DC | PRN
  Administered 2019-12-15: 4 mg via INTRAVENOUS

## 2019-12-15 MED ORDER — SUGAMMADEX SODIUM 200 MG/2ML IV SOLN
INTRAVENOUS | Status: DC | PRN
  Administered 2019-12-15: 200 mg via INTRAVENOUS

## 2019-12-15 MED ORDER — PROPOFOL 10 MG/ML IV BOLUS
INTRAVENOUS | Status: DC | PRN
  Administered 2019-12-15: 170 mg via INTRAVENOUS

## 2019-12-15 MED ORDER — FENTANYL CITRATE (PF) 100 MCG/2ML IJ SOLN
INTRAMUSCULAR | Status: AC
  Filled 2019-12-15: qty 2

## 2019-12-15 MED ORDER — ROCURONIUM BROMIDE 10 MG/ML (PF) SYRINGE
PREFILLED_SYRINGE | INTRAVENOUS | Status: AC
  Filled 2019-12-15: qty 10

## 2019-12-15 MED ORDER — PROPOFOL 10 MG/ML IV BOLUS
INTRAVENOUS | Status: AC
  Filled 2019-12-15: qty 20

## 2019-12-15 MED ORDER — ACETAMINOPHEN 500 MG PO TABS
1000.0000 mg | ORAL_TABLET | Freq: Once | ORAL | Status: AC
  Administered 2019-12-15: 1000 mg via ORAL

## 2019-12-15 MED ORDER — FENTANYL CITRATE (PF) 100 MCG/2ML IJ SOLN
25.0000 ug | INTRAMUSCULAR | Status: DC | PRN
  Administered 2019-12-15: 25 ug via INTRAVENOUS

## 2019-12-15 MED ORDER — LIDOCAINE 2% (20 MG/ML) 5 ML SYRINGE
INTRAMUSCULAR | Status: AC
  Filled 2019-12-15: qty 5

## 2019-12-15 MED ORDER — POVIDONE-IODINE 10 % EX SWAB: 2.0000 "application " | Freq: Once | CUTANEOUS | Status: DC

## 2019-12-15 MED ORDER — ONDANSETRON HCL 4 MG/2ML IJ SOLN
INTRAMUSCULAR | Status: AC
  Filled 2019-12-15: qty 2

## 2019-12-15 MED ORDER — CEFAZOLIN SODIUM-DEXTROSE 2-4 GM/100ML-% IV SOLN
INTRAVENOUS | Status: AC
  Filled 2019-12-15: qty 100

## 2019-12-15 MED ORDER — SODIUM CHLORIDE 0.9 % IR SOLN
Status: DC | PRN
  Administered 2019-12-15 (×2): 3000 mL

## 2019-12-15 MED ORDER — KETOROLAC TROMETHAMINE 30 MG/ML IJ SOLN
INTRAMUSCULAR | Status: AC
  Filled 2019-12-15: qty 1

## 2019-12-15 MED ORDER — SCOPOLAMINE 1 MG/3DAYS TD PT72
1.0000 | MEDICATED_PATCH | TRANSDERMAL | Status: DC
  Administered 2019-12-15: 1.5 mg via TRANSDERMAL

## 2019-12-15 MED ORDER — EPHEDRINE SULFATE 50 MG/ML IJ SOLN
INTRAMUSCULAR | Status: DC | PRN
  Administered 2019-12-15: 5 mg via INTRAVENOUS

## 2019-12-15 MED ORDER — KETOROLAC TROMETHAMINE 30 MG/ML IJ SOLN
INTRAMUSCULAR | Status: DC | PRN
  Administered 2019-12-15: 30 mg via INTRAVENOUS

## 2019-12-15 MED ORDER — CEFAZOLIN SODIUM-DEXTROSE 2-4 GM/100ML-% IV SOLN
2.0000 g | INTRAVENOUS | Status: AC
  Administered 2019-12-15: 2 g via INTRAVENOUS

## 2019-12-15 MED ORDER — OXYCODONE HCL 5 MG/5ML PO SOLN: 5.0000 mg | Freq: Once | ORAL | Status: AC | PRN

## 2019-12-15 MED ORDER — OXYCODONE HCL 5 MG PO TABS
ORAL_TABLET | ORAL | Status: AC
  Filled 2019-12-15: qty 1

## 2019-12-15 MED ORDER — OXYCODONE HCL 5 MG PO TABS
5.0000 mg | ORAL_TABLET | Freq: Once | ORAL | Status: AC | PRN
  Administered 2019-12-15: 5 mg via ORAL

## 2019-12-15 MED ORDER — PROMETHAZINE HCL 25 MG/ML IJ SOLN: 6.2500 mg | INTRAMUSCULAR | Status: DC | PRN

## 2019-12-15 MED ORDER — LACTATED RINGERS IV SOLN: INTRAVENOUS | Status: DC

## 2019-12-15 MED ORDER — MIDAZOLAM HCL 2 MG/2ML IJ SOLN
INTRAMUSCULAR | Status: AC
  Filled 2019-12-15: qty 2

## 2019-12-15 MED ORDER — DEXAMETHASONE SODIUM PHOSPHATE 10 MG/ML IJ SOLN
INTRAMUSCULAR | Status: DC | PRN
  Administered 2019-12-15 (×2): 5 mg via INTRAVENOUS

## 2019-12-15 MED ORDER — AMISULPRIDE (ANTIEMETIC) 5 MG/2ML IV SOLN: 10.0000 mg | Freq: Once | INTRAVENOUS | Status: DC | PRN

## 2019-12-15 SURGICAL SUPPLY — 73 items
ADH SKN CLS APL DERMABOND .7 (GAUZE/BANDAGES/DRESSINGS) ×2
APL SRG 38 LTWT LNG FL B (MISCELLANEOUS) ×2
APPLICATOR ARISTA FLEXITIP XL (MISCELLANEOUS) ×3 IMPLANT
BAG LAPAROSCOPIC 12 15 PORT 16 (BASKET) IMPLANT
BAG RETRIEVAL 10 (BASKET)
BAG RETRIEVAL 12/15 (BASKET)
BAG SPEC RTRVL LRG 6X4 10 (ENDOMECHANICALS) ×4
BARRIER ADHS 3X4 INTERCEED (GAUZE/BANDAGES/DRESSINGS) IMPLANT
BRR ADH 4X3 ABS CNTRL BYND (GAUZE/BANDAGES/DRESSINGS)
CABLE HIGH FREQUENCY MONO STRZ (ELECTRODE) ×3 IMPLANT
CATH ROBINSON RED A/P 16FR (CATHETERS) IMPLANT
CNTNR URN SCR LID CUP LEK RST (MISCELLANEOUS) IMPLANT
CONT SPEC 4OZ STRL OR WHT (MISCELLANEOUS)
COVER MAYO STAND STRL (DRAPES) ×3 IMPLANT
COVER WAND RF STERILE (DRAPES) ×3 IMPLANT
DERMABOND ADVANCED (GAUZE/BANDAGES/DRESSINGS) ×1
DERMABOND ADVANCED .7 DNX12 (GAUZE/BANDAGES/DRESSINGS) ×2 IMPLANT
DEVICE MYOSURE LITE (MISCELLANEOUS) IMPLANT
DEVICE MYOSURE REACH (MISCELLANEOUS) IMPLANT
DILATOR CANAL MILEX (MISCELLANEOUS) IMPLANT
DRSG COVADERM PLUS 2X2 (GAUZE/BANDAGES/DRESSINGS) IMPLANT
DRSG OPSITE POSTOP 3X4 (GAUZE/BANDAGES/DRESSINGS) IMPLANT
DURAPREP 26ML APPLICATOR (WOUND CARE) ×3 IMPLANT
ELECT REM PT RETURN 9FT ADLT (ELECTROSURGICAL) ×3
ELECTRODE REM PT RTRN 9FT ADLT (ELECTROSURGICAL) ×2 IMPLANT
GAUZE 4X4 16PLY RFD (DISPOSABLE) ×3 IMPLANT
GLOVE BIO SURGEON STRL SZ 6.5 (GLOVE) ×3 IMPLANT
GLOVE BIO SURGEON STRL SZ8 (GLOVE) ×6 IMPLANT
GLOVE BIOGEL PI IND STRL 7.0 (GLOVE) ×6 IMPLANT
GLOVE BIOGEL PI INDICATOR 7.0 (GLOVE) ×3
GOWN STRL REUS W/TWL LRG LVL3 (GOWN DISPOSABLE) ×6 IMPLANT
GOWN STRL REUS W/TWL XL LVL3 (GOWN DISPOSABLE) ×3 IMPLANT
HEMOSTAT ARISTA ABSORB 3G PWDR (HEMOSTASIS) ×3 IMPLANT
IV NS IRRIG 3000ML ARTHROMATIC (IV SOLUTION) ×6 IMPLANT
KIT PROCEDURE FLUENT (KITS) IMPLANT
LIGASURE VESSEL 5MM BLUNT TIP (ELECTROSURGICAL) ×3 IMPLANT
MANIPULATOR UTERINE 4.5 ZUMI (MISCELLANEOUS) IMPLANT
MYOSURE XL FIBROID (MISCELLANEOUS)
NDL SAFETY ECLIPSE 18X1.5 (NEEDLE) IMPLANT
NEEDLE HYPO 18GX1.5 SHARP (NEEDLE)
PACK LAPAROSCOPY BASIN (CUSTOM PROCEDURE TRAY) ×3 IMPLANT
PACK TRENDGUARD 450 HYBRID PRO (MISCELLANEOUS) ×2 IMPLANT
PACK VAGINAL MINOR WOMEN LF (CUSTOM PROCEDURE TRAY) ×3 IMPLANT
PAD OB MATERNITY 4.3X12.25 (PERSONAL CARE ITEMS) ×3 IMPLANT
PAD PREP 24X48 CUFFED NSTRL (MISCELLANEOUS) ×3 IMPLANT
POUCH SPECIMEN RETRIEVAL 10MM (ENDOMECHANICALS) ×6 IMPLANT
PROTECTOR NERVE ULNAR (MISCELLANEOUS) IMPLANT
SCISSORS LAP 5X35 DISP (ENDOMECHANICALS) IMPLANT
SEAL CERVICAL OMNI LOK (ABLATOR) IMPLANT
SEAL ROD LENS SCOPE MYOSURE (ABLATOR) ×3 IMPLANT
SET SUCTION IRRIG HYDROSURG (IRRIGATION / IRRIGATOR) ×3 IMPLANT
SET TUBE SMOKE EVAC HIGH FLOW (TUBING) ×3 IMPLANT
SOLUTION ELECTROLUBE (MISCELLANEOUS) IMPLANT
SUT MNCRL AB 4-0 PS2 18 (SUTURE) ×6 IMPLANT
SUT VICRYL 0 UR6 27IN ABS (SUTURE) ×3 IMPLANT
SYR 50ML LL SCALE MARK (SYRINGE) IMPLANT
SYS BAG RETRIEVAL 10MM (BASKET)
SYS RETRIEVAL 5MM INZII UNIV (BASKET)
SYSTEM BAG RETRIEVAL 10MM (BASKET) IMPLANT
SYSTEM RETRIEVL 5MM INZII UNIV (BASKET) IMPLANT
SYSTEM TISS REMOVAL MYOSURE XL (MISCELLANEOUS) IMPLANT
TOWEL OR 17X26 10 PK STRL BLUE (TOWEL DISPOSABLE) ×3 IMPLANT
TRAP SPECIMEN MUCUS 40CC (MISCELLANEOUS) ×3 IMPLANT
TRAY FOLEY W/BAG SLVR 14FR LF (SET/KITS/TRAYS/PACK) ×3 IMPLANT
TRENDGUARD 450 HYBRID PRO PACK (MISCELLANEOUS) ×3
TROCAR 12M 150ML BLUNT (TROCAR) IMPLANT
TROCAR BALLN 12MMX100 BLUNT (TROCAR) ×3 IMPLANT
TROCAR BLADELESS 15MM (ENDOMECHANICALS) IMPLANT
TROCAR BLADELESS OPT 12M 100M (ENDOMECHANICALS) IMPLANT
TROCAR BLADELESS OPT 5 100 (ENDOMECHANICALS) ×6 IMPLANT
TROCAR XCEL DIL TIP R 11M (ENDOMECHANICALS) IMPLANT
TROCAR XCEL NON-BLD 11X100MML (ENDOMECHANICALS) IMPLANT
WARMER LAPAROSCOPE (MISCELLANEOUS) ×3 IMPLANT

## 2019-12-15 NOTE — H&P (Signed)
Allison Wood is an 49 y.o. female.G1P1L1  RP: LPS Left Salpingectomy/HSC/Myosure Excision/D+C  HPI:  No change x last visit. Left pelvic pain with large Hydrosalpinx.  Also has a tendency for constipation.  Metrorrhagia with a probable Endometrial Polyp 5 mm.  Menstrual History: Patient's last menstrual period was 11/08/2019.    Past Medical History:  Diagnosis Date  . Anemia 2016   none since  . Hydrosalpinx   . Vitamin A deficiency     Past Surgical History:  Procedure Laterality Date  . NO PAST SURGERIES      Family History  Problem Relation Age of Onset  . Diabetes Father   . Cancer Father        lung     Social History:  reports that she has never smoked. She has never used smokeless tobacco. She reports previous alcohol use. She reports that she does not use drugs.  Allergies: No Known Allergies  Medications Prior to Admission  Medication Sig Dispense Refill Last Dose  . Vitamin D, Ergocalciferol, (DRISDOL) 1.25 MG (50000 UNIT) CAPS capsule Take 50,000 Units by mouth every 7 (seven) days. tuesday   12/14/2019 at Unknown time    REVIEW OF SYSTEMS: A ROS was performed and pertinent positives and negatives are included in the history.  GENERAL: No fevers or chills. HEENT: No change in vision, no earache, sore throat or sinus congestion. NECK: No pain or stiffness. CARDIOVASCULAR: No chest pain or pressure. No palpitations. PULMONARY: No shortness of breath, cough or wheeze. GASTROINTESTINAL: No abdominal pain, nausea, vomiting or diarrhea, melena or bright red blood per rectum. GENITOURINARY: No urinary frequency, urgency, hesitancy or dysuria. MUSCULOSKELETAL: No joint or muscle pain, no back pain, no recent trauma. DERMATOLOGIC: No rash, no itching, no lesions. ENDOCRINE: No polyuria, polydipsia, no heat or cold intolerance. No recent change in weight. HEMATOLOGICAL: No anemia or easy bruising or bleeding. NEUROLOGIC: No headache, seizures, numbness, tingling or  weakness. PSYCHIATRIC: No depression, no loss of interest in normal activity or change in sleep pattern.     Blood pressure 118/79, temperature 98.9 F (37.2 C), resp. rate 14, height 5\' 7"  (1.702 m), weight 64.5 kg, last menstrual period 11/08/2019, SpO2 100 %.  Physical Exam:  See office notes   Results for orders placed or performed during the hospital encounter of 12/15/19 (from the past 24 hour(s))  Pregnancy, urine POC     Status: None   Collection Time: 12/15/19  7:39 AM  Result Value Ref Range   Preg Test, Ur NEGATIVE NEGATIVE   Covid Negative  Pelvic US 11/11/2019: T/V and T/Aimages.Anteverted uterus normal in size and shape with 2 small intramural fibroids. The overall uterine size is measured at 7.83 x 5.65 x 4.25 cm. Fluid within the endometrial cavity outlining a 5 mm intracavitary mass towards the right cornua compatible with an endometrial polyp. The right ovary is small with atrophic appearance. Large serpiginous mass arising from the left adnexa into the cul-de-sac measuring 12.2 x 9.4 cm compatible with a left hydrosalpinx. Not able to definitely identify the left ovary separate from the large mass. No free fluid in the posterior cul-de-sac.   Assessment/Plan:  49 y.o. G1P1   1. Hydrosalpinx Large left Hydrosalpinx with left pelvic pain.  Metrorrhagia with an IU lesion c/w a Polyp 5 mm.  Decision to proceed with LPS Left Salpingectomy/possible lysis of adhesions/ Hysteroscopy with Myosure excision and D+C.  Preop recommendations, surgical procedure and risks, as well as postop precautions and expectations thoroughly reviewed  with patient.  Questions answered.  Patient voiced understanding and agreement with plan.   2. Endometrial polyp As above.                        Patient was counseled as to the risk of surgery to include the following:  1. Infection (prohylactic antibiotics will be administered)  2. DVT/Pulmonary Embolism (prophylactic pneumo  compression stockings will be used)  3.Trauma to internal organs requiring additional surgical procedure to repair any injury to internal organs requiring perhaps additional hospitalization days.  4.Hemmorhage requiring transfusion and blood products which carry risks such as anaphylactic reaction, hepatitis and AIDS  Patient had received literature information on the procedure scheduled and all her questions were answered and fully accepts all risk.   Marie-Lyne Delmore Sear 12/15/2019, 8:03 AM

## 2019-12-15 NOTE — Op Note (Signed)
Operative Note  12/15/2019  1:03 PM  PATIENT:  Allison Wood  49 y.o. female  PRE-OPERATIVE DIAGNOSIS:  Left large hydrosalpinx, left sided pelvic pain, endometrial polyp  POST-OPERATIVE DIAGNOSIS:  Left large ovarian cyst with severe adhesions  PROCEDURE:  Procedure(s): LAPAROSCOPIC LEFT SALPINGOOPHORECTOMY, LEFT OVARIAN CYSTECTOMY, EXTENSIVE LYSIS OF ADHESIONS AND PERITONEAL WASHINGS  SURGEON:  Surgeon(s): [name], MD Joseph Pierini, MD  ANESTHESIA:   general  FINDINGS: Large complex left ovarian cyst with severe adhesions of the left ovary with the omentum, sigmoid and left ovarian fossa.  DESCRIPTION OF OPERATION: Under general anesthesia with endotracheal intubation the patient is in lithotomy position.  She is prepped with DuraPrep on the abdomen and with Betadine on the suprapubic, vulvar and vaginal areas.  She is draped as usual.  The Foley is inserted in the bladder.  Timeout is done.  The vaginal exam reveals a very large mass that reaches just below the umbilicus on the left side.  The speculum is inserted in the vagina.  The uterine cannula is inserted with a tenaculum on the anterior lip of the cervix.  The speculum is removed.  We go to the abdomen.  The supraumbilical area is infiltrated with Marcaine one quarter plain.  A 1.5 cm incision is done with the scalpel at that level.  The aponeurosis is grasped with Kocher clamps.  The aponeurosis is opened under direct vision with the Mayo scissors.  The parietal peritoneum is also open with Mayo scissors under direct vision.  A pursestring stitch of Vicryl 0 is done at the aponeurosis.  The Sheryle Hail is inserted under direct vision.  A pneumoperitoneum is created.  The camera is inserted for inspection of the abdominopelvic cavities.  Inspection reveals a large about 12 to 14 cm left ovarian cyst adherent to the left ovarian fossa, the posterior pelvis and to the omentum.  The surface of the cyst is benign in appearance.   No ascites present.  The right ovary and tube are normal.  The uterus is normal in size and appearance.  No other pathology in the abdomen or pelvis.  5 mm incisions are done after infiltrating Marcaine one quarter plain at the right and left lower abdomen.  5 mm ports are inserted under direct vision.  We start with peritoneal washings.  We then proceeded to perform a left ovarian cystectomy.  The omental adhesions were lysed.  We used atraumatic clamps and the hot scissors.  During the process the cyst ruptured. Solid areas were present within the cyst which raised the possibility that it was not a benign process.  We used the Endobag to put the ovarian cyst wall in and removed it through the [place] using a 5 mm camera and the assistant port.  The cyst wall with the solid portions were sent for fresh frozen section.  Given that this would give [place] an idea but would not be the final pathology, we decided to proceed with a left salpingo-oophorectomy.  The left ureter was seen in normal anatomic position with good peristalsis.  We used the harmonic to cauterize and section the left infundibulopelvic ligament.  We cauterized and sectioned below the left ovary and tube with the harmonic.  We then proceeded with meticulous lysis of adhesion between the left ovary and the sigmoid, as well as with the left ovarian fossa.  We cauterized and sectioned the left tube proximally.  We cauterized and sectioned the left utero-ovarian ligament.  We finally meticulously lysed the adhesions between  the left ovary and the sigmoid staying on the ovary to avoid any trauma to the bowels.  Hemostasis was adequate at all levels.  We used an Endobag to produce a specimen in with a 5 mm camera and the assistant port.  The left ovary and tube were removed and sent to pathology.  We irrigated and suction the abdominopelvic cavities.  Hemostasis was confirmed once more.  We had seen the uterine cannula perforated the uterus during the surgery.   Hemostasis was adequate at that level.  That prevented [place] from proceeding with a hysteroscopy. The very small 5 mm possible polyp will be reassessed by pelvic ultrasound within the next 3 months.  All laparoscopic instruments were removed under direct vision.  All ports were removed under direct vision.  The CO2 was evacuated.  The supraumbilical incision was closed by first attaching the pursestring stitch.  We then closed the 3 skin incisions with a Vicryl 4-0 in a subcuticular suture.  Dermabond was added on the 3 incisions.  The cannula and tenaculum were removed.  Hemostasis was adequate vaginally.  The patient was brought to recovery room in good and stable status.  ESTIMATED BLOOD LOSS: 50 mL   Intake/Output Summary (Last 24 hours) at 12/15/2019 1303 Last data filed at 12/15/2019 1206 Gross per 24 hour  Intake 1600 ml  Output 250 ml  Net 1350 ml     BLOOD ADMINISTERED:none   LOCAL MEDICATIONS USED:  MARCAINE     SPECIMEN:  Source of Specimen:  Left Ovary and Tube/Peritoneal washings  DISPOSITION OF SPECIMEN: Fresh Frozen and definitive  PATHOLOGY  COUNTS:  YES  PLAN OF CARE: Transfer to PACU  Marie-Lyne LavoieMD1:03 PM

## 2019-12-15 NOTE — Transfer of Care (Signed)
Immediate Anesthesia Transfer of Care Note  Patient: Allison Wood  Procedure(s) Performed: Procedure(s) (LRB): LAPAROSCOPIC; LEFT SALPINGOOPHORECTOMY, LEFT OVARIAN CYSTECTOMY, EXTENSIVE LYSIS OF ADHESIONS AND PERITONEAL WASHINGS (Left)  Patient Location: PACU  Anesthesia Type: General  Level of Consciousness: awake, oriented, sedated and patient cooperative  Airway & Oxygen Therapy: Patient Spontanous Breathing and Patient connected to face mask oxygen  Post-op Assessment: Report given to PACU RN and Post -op Vital signs reviewed and stable  Post vital signs: Reviewed and stable  Complications: No apparent anesthesia complications  Last Vitals:  Vitals Value Taken Time  BP 118/83 12/15/19 1200  Temp    Pulse 74 12/15/19 1206  Resp 18 12/15/19 1206  SpO2 100 % 12/15/19 1206  Vitals shown include unvalidated device data.  Last Pain:  Vitals:   12/15/19 0754  PainSc: 2       Patients Stated Pain Goal: 4 (93/96/88 6484)  Complications: No complications documented.

## 2019-12-15 NOTE — Anesthesia Postprocedure Evaluation (Signed)
Anesthesia Post Note  Patient: Allison Wood  Procedure(s) Performed: LAPAROSCOPIC; LEFT SALPINGOOPHORECTOMY, LEFT OVARIAN CYSTECTOMY, EXTENSIVE LYSIS OF ADHESIONS AND PERITONEAL WASHINGS (Left Abdomen)     Patient location during evaluation: PACU Anesthesia Type: General Level of consciousness: awake and alert Pain management: pain level controlled Vital Signs Assessment: post-procedure vital signs reviewed and stable Respiratory status: spontaneous breathing, nonlabored ventilation, respiratory function stable and patient connected to nasal cannula oxygen Cardiovascular status: blood pressure returned to baseline and stable Postop Assessment: no apparent nausea or vomiting Anesthetic complications: no   No complications documented.  Last Vitals:  Vitals:   12/15/19 1230 12/15/19 1245  BP: 122/77   Pulse: 64 65  Resp: 14 13  Temp:    SpO2: 100% 100%    Last Pain:  Vitals:   12/15/19 1230  PainSc: [place]ellamarie Randa

## 2019-12-15 NOTE — Anesthesia Procedure Notes (Signed)
Procedure Name: Intubation Date/Time: 12/15/2019 8:59 AM Performed by: Suan Halter, CRNA Pre-anesthesia Checklist: Patient identified, Emergency Drugs available, Suction available and Patient being monitored Patient Re-evaluated:Patient Re-evaluated prior to induction Oxygen Delivery Method: Circle system utilized Preoxygenation: Pre-oxygenation with 100% oxygen Induction Type: IV induction Ventilation: Mask ventilation without difficulty Laryngoscope Size: Mac and 3 Grade View: Grade I Tube type: Oral Tube size: 7.0 mm Number of attempts: 1 Airway Equipment and Method: Stylet and Oral airway Placement Confirmation: ETT inserted through vocal cords under direct vision,  positive ETCO2 and breath sounds checked- equal and bilateral Secured at: 22 cm Tube secured with: Tape Dental Injury: Teeth and Oropharynx as per pre-operative assessment

## 2019-12-15 NOTE — Discharge Instructions (Signed)
Salpingooforectoma unilateral, cuidados posteriores Unilateral Salpingo-Oophorectomy, Care After Lea esta informacin sobre cmo cuidarse despus del procedimiento. Su mdico tambin podr darle indicaciones ms especficas. Comunquese con su mdico si tiene problemas o preguntas. Qu puedo esperar despus del procedimiento? Despus del procedimiento, es comn [employer] siguientes sntomas:  Dolor abdominal.  Algn sangrado vaginal ocasional (pequeas manchas).  Cansancio. Siga estas indicaciones en su casa: Cuidados de la incisin   Mantenga la zona de la incisin y el vendaje limpios y secos.  Siga las indicaciones del mdico acerca del cuidado de la incisin. Haga lo siguiente: ? Lvese las manos con agua y [place] antes de cambiar el vendaje. Use desinfectante para manos si no dispone de [place] y [place]. ? Cambie el vendaje como se lo haya indicado el mdico. ? No retire los puntos (suturas), las grapas, la goma para cerrar la piel o las tiras [place]. Es posible que estos cierres cutneos Animal nutritionist en la piel durante 2semanas o ms tiempo. Si los bordes de las tiras adhesivas empiezan a despegarse y Therapist, sports, puede recortar los que estn sueltos. No retire las tiras [facility] por completo a menos que el mdico se lo indique.  Sarepta zona de la incisin para detectar signos de infeccin. Est atento a los siguientes signos: ? Dolor, hinchazn o enrojecimiento. ? Lquido o sangre. ? Calor. ? Pus o mal olor. Actividad  No conduzca ni use maquinaria pesada mientras toma analgsicos recetados.  No conduzca durante 24horas si le administraron un medicamento para ayudarlo a que se relaje (sedante).  Haga caminatas cortas y frecuentes Agricultural consultant. Descanse cuando se sienta cansada. Pregntele al mdico qu actividades son seguras para usted.  Evite actividades que requieran un gran esfuerzo. Tambin, evite levantar pesos [employer]. No levante ningn  objeto que pese ms de 5libras (2,3kg) o el lmite de peso que le indique su mdico hasta que este le diga que puede [place].  No se haga duchas vaginales, no use tampones ni tenga relaciones sexuales hasta que su mdico lo autorice. Instrucciones generales  A fin de prevenir o tratar el estreimiento mientras toma analgsicos recetados, el mdico puede recomendarle lo siguiente: ? Beber suficiente lquido para mantener la orina de color amarillo plido. ? Tomar medicamentos recetados o de [employer]. ? Consumir alimentos ricos en fibra, como frutas y verduras frescas, cereales integrales y frijoles. ? Limitar el consumo de alimentos ricos en grasas y azcares procesados, como alimentos fritos o dulces.  Tome los medicamentos de venta libre y los recetados solamente como se lo haya indicado el mdico.  No tome baos de inmersin, no nade ni use el jacuzzi hasta que el mdico lo autorice. Pregntele al mdico si puede ducharse. Thurston Pounds solo le permitan darse baos de [place].  Use las medias de compresin como se lo haya indicado el mdico. Estas medias ayudan a Mining engineer formacin de cogulos de [place] y a reducir la hinchazn de las piernas.  Concurra a todas las visitas de control como se lo haya indicado el mdico. Esto es importante. Comunquese con un mdico si:  Siente dolor al [employer].  Tiene una secrecin con feo olor o pus que proviene de la vagina.  Tiene enrojecimiento, hinchazn o dolor alrededor de la incisin.  Le sale lquido o sangre de la incisin.  La incisin est caliente al tacto.  Tiene pus o percibe que sale mal olor del lugar de la incisin.  Tiene fiebre.  La incisin comienza a abrirse.  Tiene un dolor abdominal que empeora o que no mejora con los medicamentos.  Le aparece una erupcin cutnea.  Presenta nuseas o vmitos.  Se siente mareado. Solicite ayuda de inmediato si:  Tree surgeon en el pecho o en la pierna.  Le falta el aire.  Se  desmaya.  Observa un aumento de la hemorragia vaginal. Resumen  Despus del procedimiento, es comn Patent attorney, cansancio y sangrado ocasional de la vagina.  Siga las indicaciones del mdico acerca del cuidado de la incisin.  Controle la incisin todos los das para detectar signos de infeccin e informe sobre cualquier sntoma a su mdico.  Siga las indicaciones del mdico respecto de las actividades y Futures trader. Esta informacin no tiene Marine scientist el consejo del mdico. Asegrese de hacerle al mdico cualquier pregunta que tenga. Document Revised: 10/22/2016 Document Reviewed: 10/22/2016 Elsevier Patient Education  [place] de diagnstico Diagnostic Laparoscopy La laparoscopa de diagnstico es un procedimiento que se realiza para diagnosticar enfermedades en el abdomen. Podra realizarse por diversos motivos, como buscar tejido cicatricial, cncer o la razn de un dolor en el abdomen (abdominal). Durante el procedimiento, se inserta un tubo delgado y flexible que tiene una luz y una cmara en el extremo (laparoscopio) a Lawerance Cruel de una incisin en el abdomen. La imagen de la cmara se [place] en un monitor para ayudar al cirujano a ver dentro del cuerpo. Informe al mdico acerca de lo siguiente:  Cualquier alergia que tenga.  Todos los [employer], incluidos vitaminas, hierbas, gotas oftlmicas, cremas y medicamentos de venta libre.  Cualquier problema que usted o sus familiares hayan tenido con anestsicos.  Cualquier enfermedad de la sangre que tenga.  Cirugas a las que se someti.  Cualquier afeccin mdica que tenga. Cules son los riesgos? En general, se trata de un procedimiento seguro. Sin embargo, pueden ocurrir complicaciones, por ejemplo:  Infeccin.  Hemorragia.  Reacciones alrgicas a los medicamentos o a los tintes.  Lesiones en estructuras u rganos abdominales, como los intestinos, el hgado, el estmago o  el bazo. Qu ocurre antes del procedimiento? Medicamentos  Consulte al mdico sobre: ? Quarry manager o suspender los medicamentos que toma habitualmente. Esto es muy importante si toma medicamentos para la diabetes o anticoagulantes. ? Tomar medicamentos como aspirina e ibuprofeno. Estos medicamentos pueden tener un efecto anticoagulante en la [place]. No tome estos medicamentos a menos que el mdico se lo indique. ? Tomar medicamentos de [employer], vitaminas, hierbas y suplementos.  Pueden indicarle un antibitico para ayudar a prevenir infecciones. Mantenerse hidratado Siga las indicaciones del mdico acerca de mantenerse hidratado, las cuales pueden incluir lo siguiente:  Hasta 2horas antes del procedimiento, puede beber lquidos transparentes, como agua, jugos de fruta sin pulpa, caf negro y t solo. Restricciones en las comidas y bebidas Siga las indicaciones del mdico respecto de las restricciones de comidas o bebidas, las cuales pueden incluir lo siguiente:  Ocho horas antes del procedimiento, deje de ingerir comidas o alimentos difciles de digerir, como carne, alimentos fritos o alimentos con alto contenido en grasas.  Seis horas antes del procedimiento, deje de ingerir comidas o alimentos fciles de digerir, como tostadas o cereales.  Seis horas antes del procedimiento, deje de beber [place] o bebidas que [employer].  Dos horas antes del procedimiento, deje de beber lquidos transparentes. Instrucciones generales  Pregntele al mdico cmo se marcar o se Museum/gallery curator de la ciruga.  Es posible que le indiquen que se bae con un [place]  antisptico.  Haga que alguien lo lleve a su casa desde el hospital o la clnica.  Pdale a un adulto responsable que lo cuide durante al menos 24horas despus de que le den el alta del hospital o de la [place]. Esto es importante. Qu ocurre durante el procedimiento?   Para reducir el riesgo de infecciones: ? El equipo mdico  se lavar o se desinfectar las manos. ? Pueden rasurarle la zona [place]. ? Le lavarn la piel con jabn.  Le colocarn una va intravenosa en una de las venas.  Le administrarn un medicamento para hacerlo dormir (anestesia general). Tambin le administrarn un medicamento para ayudarlo a relajarse (sedante).  Le colocarn un tubo que pasar por la garganta para que pueda respirar durante el procedimiento.  El abdomen se llena de un gas similar al aire para expandirlo. Esto permitir que el cirujano tenga ms espacio para operar y Museum/gallery exhibitions officer visualizacin de los rganos.  Le practicarn varias incisiones pequeas en el abdomen.  A travs de las incisiones, se introducirn un laparoscopio y otros instrumentos quirrgicos en el abdomen.  Se puede tomar [place] de tejido de un rgano para su anlisis (biopsia). Esto depender del motivo por el cual se le est realizando este procedimiento.  Se retirarn del abdomen el laparoscopio y los dems instrumentos.  Se extraer el gas.  Las incisiones se cerrarn con puntos (suturas) y se cubrirn con una venda (vendaje).  Le quitarn el tubo de respiracin. Este procedimiento puede variar segn el mdico y el hospital. Sander Nephew ocurre despus del procedimiento?   Le controlarn la presin arterial, la frecuencia cardaca, la frecuencia respiratoria y Retail buyer de oxgeno en la sangre hasta que desaparezca el efecto de los medicamentos administrados.  No conduzca durante 24horas si le administraron un sedante durante el procedimiento.  Es su responsabilidad retirar [employer] del procedimiento. Pregntele al mdico o a alguien del departamento donde se realice el procedimiento cundo estarn [employer]. Resumen  La laparoscopa de diagnstico es una manera de detectar problemas dentro del abdomen mediante pequeas incisiones.  Siga las indicaciones del mdico acerca de cmo prepararse para este  procedimiento.  Pdale a un adulto responsable que lo cuide durante al menos 24horas despus de que le den el alta del hospital o de la [place]. Esto es importante. Esta informacin no tiene Marine scientist el consejo del mdico. Asegrese de hacerle al mdico cualquier pregunta que tenga. Document Revised: 06/27/2017 Document Reviewed: 11/21/2016 Elsevier Patient Education  [place] INSTRUCTIONS: Laparoscopy  The following instructions have been prepared to help you care for yourself upon your return home today.  Wound care:  Do not get the incision wet for the first 24 hours. The incision should be kept clean and dry.  The Band-Aids or dressings may be removed the day after surgery.  Should the incision become sore, red, and swollen after the first week, check with your doctor.  Personal hygiene:  Shower the day after your procedure.  Activity and limitations:  Do NOT drive or operate any equipment today.  Do NOT lift anything more than 15 pounds for 2-3 weeks after surgery.  Do NOT rest in bed all day.  Walking is encouraged. Walk each day, starting slowly with 5-minute walks 3 or 4 times a day. Slowly increase the length of your walks.  Walk up and down stairs slowly.  Do NOT do strenuous activities, such as golfing, playing tennis, bowling, running, biking, weight  lifting, gardening, mowing, or vacuuming for 2-4 weeks. Ask your doctor when it is okay to start.  Diet: Eat a light meal as desired this evening. You may resume your usual diet tomorrow.  Return to work: This is dependent on the type of work you do. For the most part you can return to a desk job within a week of surgery. If you are more active at work, please discuss this with your doctor.  What to expect after your surgery: You may have a slight burning sensation when you urinate on the first day. You may have a very small amount of blood in the urine. Expect to have a small  amount of vaginal discharge/light bleeding for 1-2 weeks. It is not unusual to have abdominal soreness and bruising for up to 2 weeks. You may be tired and need more rest for about 1 week. You may experience shoulder pain for 24-72 hours. Lying flat in bed may relieve it.  Call your doctor for any of the following:  Develop a fever of 100.4 or greater  Inability to urinate 6 hours after discharge from hospital  Severe pain not relieved by pain medications  Persistent of heavy bleeding at incision site  Redness or swelling around incision site after a week  Increasing nausea or vomiting  Patient Signature________________________________________ Nurse Signature_________________________________________   NO IBUPROFEN PRODUCTS (MOTRIN, ADVIL) OR ALEVE UNTIL AFTER 5:50PM TODAY.    Post Anesthesia Home Care Instructions  Activity: Get plenty of rest for the remainder of the day. A responsible individual must stay with you for 24 hours following the procedure.  For the next 24 hours, DO NOT: -Drive a car -Paediatric nurse -Drink alcoholic beverages -Take any medication unless instructed by your physician -Make any legal decisions or sign important papers.  Meals: Start with liquid foods such as gelatin or soup. Progress to regular foods as tolerated. Avoid greasy, spicy, heavy foods. If nausea and/or vomiting occur, drink only clear liquids until the nausea and/or vomiting subsides. Call your physician if vomiting continues.  Special Instructions/Symptoms: Your throat may feel dry or sore from the anesthesia or the breathing tube placed in your throat during surgery. If this causes discomfort, gargle with warm salt water. The discomfort should disappear within 24 hours.  If you had a scopolamine patch placed behind your ear for the management of post- operative nausea and/or vomiting:  1. The medication in the patch is effective for 72 hours, after which it should be removed.   Wrap patch in a tissue and discard in the trash. Wash hands thoroughly with soap and water. 2. You may remove the patch earlier than 72 hours if you experience unpleasant side effects which may include dry mouth, dizziness or visual disturbances. 3. Avoid touching the patch. Wash your hands with soap and water after contact with the patch.

## 2019-12-16 ENCOUNTER — Encounter (HOSPITAL_BASED_OUTPATIENT_CLINIC_OR_DEPARTMENT_OTHER): Payer: Self-pay | Admitting: Obstetrics & Gynecology

## 2019-12-16 LAB — SURGICAL PATHOLOGY

## 2019-12-16 LAB — CYTOLOGY - NON PAP

## 2019-12-28 ENCOUNTER — Ambulatory Visit
Admission: RE | Admit: 2019-12-28 | Discharge: 2019-12-28 | Disposition: A | Discharge status: Home / Self Care | Payer: BC Managed Care – PPO | Source: Ambulatory Visit | Attending: Obstetrics & Gynecology | Admitting: Obstetrics & Gynecology

## 2019-12-28 ENCOUNTER — Other Ambulatory Visit: Payer: Self-pay

## 2019-12-28 DIAGNOSIS — R928 Other abnormal and inconclusive findings on diagnostic imaging of breast: Secondary | ICD-10-CM

## 2020-01-13 ENCOUNTER — Other Ambulatory Visit: Payer: Self-pay

## 2020-01-13 ENCOUNTER — Encounter: Payer: Self-pay | Admitting: Obstetrics & Gynecology

## 2020-01-13 ENCOUNTER — Ambulatory Visit (INDEPENDENT_AMBULATORY_CARE_PROVIDER_SITE_OTHER): Payer: BC Managed Care – PPO | Admitting: Obstetrics & Gynecology

## 2020-01-13 VITALS — BP 120/80

## 2020-01-13 DIAGNOSIS — N914 Secondary oligomenorrhea: Secondary | ICD-10-CM

## 2020-01-13 DIAGNOSIS — Z09 Encounter for follow-up examination after completed treatment for conditions other than malignant neoplasm: Secondary | ICD-10-CM

## 2020-01-13 NOTE — Progress Notes (Signed)
    Allison Wood 09/04/1970 [id number]        49 y.o.  G1P1L1 Married  RP: Postop LPS Left SO 12/15/2019  HPI: Doing very well.  No abdominopelvic pain.  No abnormal vaginal d/c.  No fever.  Urine/BMs normal.  No menses x 2 months.  No menopausal vasomotor Sxs.   OB History  Gravida Para Term Preterm AB Living  1 1       1   SAB TAB Ectopic Multiple Live Births               # Outcome Date GA Lbr Len/2nd Weight Sex Delivery Anes PTL Lv  1 Para             Past medical history,surgical history, problem list, medications, allergies, family history and social history were all reviewed and documented in the EPIC chart.   Directed ROS with pertinent positives and negatives documented in the history of present illness/assessment and plan.  Exam:  Vitals:   01/13/20 0854  BP: 120/80   General appearance:  Normal  Abdomen: Soft, normal  Gynecologic exam: Vulva normal.  Uterus AV, normal, mobile, NT.  No adnexal mass felt, NT.  FINAL MICROSCOPIC DIAGNOSIS:   A. OVARIAN CYST, LEFT, CYSTECTOMY:  - Endometriotic cyst of ovary.   B. OVARY AND FALLOPIAN TUBE, LEFT, SALPINGO-OOPHORECTOMY:   - Endometriotic cyst of ovary.  - Endometriosis of fallopian tube. Mucosal salpingitis.     Assessment/Plan:  49 y.o. G1P1   1. Status post gynecological surgery, follow-up exam Good postop healing without complication. Pathology showed an endometrioma of the left ovary. Pathology results reviewed with patient.  2. Secondary oligomenorrhea No menses for 2 months. No postmenopausal vasomotor symptoms. Will observe at this time and reevaluate with an [facility] if amenorrhea for 6 months.  Allison Wood, 9:28 AM 01/13/2020

## 2020-11-17 ENCOUNTER — Other Ambulatory Visit (HOSPITAL_COMMUNITY)
Admission: RE | Admit: 2020-11-17 | Discharge: 2020-11-17 | Disposition: A | Discharge status: Home / Self Care | Payer: BC Managed Care – PPO | Source: Ambulatory Visit | Attending: Obstetrics & Gynecology | Admitting: Obstetrics & Gynecology

## 2020-11-17 ENCOUNTER — Ambulatory Visit (INDEPENDENT_AMBULATORY_CARE_PROVIDER_SITE_OTHER): Payer: BC Managed Care – PPO | Admitting: Obstetrics & Gynecology

## 2020-11-17 ENCOUNTER — Encounter: Payer: Self-pay | Admitting: Obstetrics & Gynecology

## 2020-11-17 ENCOUNTER — Other Ambulatory Visit: Payer: Self-pay

## 2020-11-17 VITALS — BP 114/70 | HR 60 | Resp 16 | Ht 67.25 in | Wt 144.0 lb

## 2020-11-17 DIAGNOSIS — N914 Secondary oligomenorrhea: Secondary | ICD-10-CM | POA: Diagnosis not present

## 2020-11-17 DIAGNOSIS — Z01419 Encounter for gynecological examination (general) (routine) without abnormal findings: Secondary | ICD-10-CM | POA: Insufficient documentation

## 2020-11-17 DIAGNOSIS — R87610 Atypical squamous cells of undetermined significance on cytologic smear of cervix (ASC-US): Secondary | ICD-10-CM | POA: Insufficient documentation

## 2020-11-17 NOTE — Progress Notes (Signed)
Analynn Sowder May 09, 1970 [id number]   History:    50 y.o. G1P1L1 Married.  Son is 73 yo, doing well.  From [place].  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   RP: Established patient preseting for annual gyn exam    HPI: S/P LPS Lt SO 12/15/2019.  Menses with normal to light flow every 1-3 months. No BTB.  No pelvic pain.  No pain with IC.  Pap ASCUS/HPV HR Neg 09/2019.  Colpo No dysplasia.  Normal vaginal secretions. Unlikely to conceive, declines contraception, but okay if does conceive.  Breasts normal.  Screening mammo 10/2019, Lt breast US 12/2019.  BMI 22.39.  Fasting Health Labs with Fam MD.   Past medical history,surgical history, family history and social history were all reviewed and documented in the EPIC chart.  Gynecologic History Patient's last menstrual period was 11/08/2020.  Obstetric History OB History  Gravida Para Term Preterm AB Living  '1 1       1  '$ SAB IAB Ectopic Multiple Live Births               # Outcome Date GA Lbr Len/2nd Weight Sex Delivery Anes PTL Lv  1 Para              ROS: A ROS was performed and pertinent positives and negatives are included in the history.  GENERAL: No fevers or chills. HEENT: No change in vision, no earache, sore throat or sinus congestion. NECK: No pain or stiffness. CARDIOVASCULAR: No chest pain or pressure. No palpitations. PULMONARY: No shortness of breath, cough or wheeze. GASTROINTESTINAL: No abdominal pain, nausea, vomiting or diarrhea, melena or bright red blood per rectum. GENITOURINARY: No urinary frequency, urgency, hesitancy or dysuria. MUSCULOSKELETAL: No joint or muscle pain, no back pain, no recent trauma. DERMATOLOGIC: No rash, no itching, no lesions. ENDOCRINE: No polyuria, polydipsia, no heat or cold intolerance. No recent change in weight. HEMATOLOGICAL: No anemia or easy bruising or bleeding. NEUROLOGIC: No headache, seizures, numbness, tingling or weakness. PSYCHIATRIC: No depression, no loss of  interest in normal activity or change in sleep pattern.     Exam:   BP 114/70   Pulse 60   Resp 16   Ht 5' 7.25" (1.708 m)   Wt 144 lb (65.3 kg)   LMP 11/08/2020   BMI 22.39 kg/m   Body mass index is 22.39 kg/m.  General appearance : Well developed well nourished female. No acute distress HEENT: Eyes: no retinal hemorrhage or exudates,  Neck supple, trachea midline, no carotid bruits, no thyroidmegaly Lungs: Clear to auscultation, no rhonchi or wheezes, or rib retractions  Heart: Regular rate and rhythm, no murmurs or gallops Breast:Examined in sitting and supine position were symmetrical in appearance, no palpable masses or tenderness,  no skin retraction, no nipple inversion, no nipple discharge, no skin discoloration, no axillary or supraclavicular lymphadenopathy Abdomen: no palpable masses or tenderness, no rebound or guarding Extremities: no edema or skin discoloration or tenderness  Pelvic: Vulva: Normal             Vagina: No gross lesions or discharge  Cervix: No gross lesions or discharge.  Pap/HPV HR done.  Uterus  AV, normal size, shape and consistency, non-tender and mobile  Adnexa  Without masses or tenderness  Anus: Normal   Assessment/Plan:  50 y.o. female for annual exam   1. Encounter for routine gynecological examination with Papanicolaou smear of cervix Normal gynecologic exam.  Pap test with high-risk HPV done.  Breast exam normal.  We will schedule a screening mammogram now.  Colonoscopy to organize at age 51.  Health labs with family physician.  Good body mass index at 22.39.  Continue with fitness and healthy nutrition. - Vitamin D 1,25 dihydroxy - Cytology - PAP( [place]) - B12  2. ASCUS of cervix with negative high risk HPV - Cytology - PAP( [place])  3. Secondary oligomenorrhea  Probable Perimenopause.  Bleeding precautions reviewed.  Symptoms of menopause reviewed.  Will use natural estrogens as needed.  Follow-up to discuss hormone  replacement therapy as needed.  [name] MD, 1:47 PM 11/17/2020

## 2020-11-19 ENCOUNTER — Encounter: Payer: Self-pay | Admitting: Obstetrics & Gynecology

## 2020-11-20 LAB — CYTOLOGY - PAP
Comment: NEGATIVE
Diagnosis: UNDETERMINED — AB
High risk HPV: NEGATIVE

## 2020-11-20 LAB — VITAMIN D 1,25 DIHYDROXY
Vitamin D 1, 25 (OH)2 Total: 43 pg/mL (ref 18–72)
Vitamin D2 1, 25 (OH)2: 10 pg/mL
Vitamin D3 1, 25 (OH)2: 33 pg/mL

## 2020-11-20 LAB — VITAMIN B12: Vitamin B-12: 1187 pg/mL — ABNORMAL HIGH (ref 200–1100)

## 2020-12-22 ENCOUNTER — Other Ambulatory Visit: Payer: Self-pay | Admitting: Obstetrics & Gynecology

## 2020-12-22 DIAGNOSIS — R87610 Atypical squamous cells of undetermined significance on cytologic smear of cervix (ASC-US): Secondary | ICD-10-CM

## 2021-01-11 ENCOUNTER — Ambulatory Visit: Payer: BC Managed Care – PPO | Admitting: Obstetrics & Gynecology

## 2021-01-11 ENCOUNTER — Other Ambulatory Visit: Payer: Self-pay

## 2021-01-11 ENCOUNTER — Other Ambulatory Visit (HOSPITAL_COMMUNITY)
Admission: RE | Admit: 2021-01-11 | Discharge: 2021-01-11 | Disposition: A | Discharge status: Home / Self Care | Payer: BC Managed Care – PPO | Source: Ambulatory Visit | Attending: Obstetrics & Gynecology | Admitting: Obstetrics & Gynecology

## 2021-01-11 ENCOUNTER — Encounter: Payer: Self-pay | Admitting: Obstetrics & Gynecology

## 2021-01-11 VITALS — BP 104/64

## 2021-01-11 DIAGNOSIS — R87610 Atypical squamous cells of undetermined significance on cytologic smear of cervix (ASC-US): Secondary | ICD-10-CM | POA: Diagnosis present

## 2021-01-11 DIAGNOSIS — N841 Polyp of cervix uteri: Secondary | ICD-10-CM | POA: Diagnosis not present

## 2021-01-11 DIAGNOSIS — Z01812 Encounter for preprocedural laboratory examination: Secondary | ICD-10-CM | POA: Diagnosis not present

## 2021-01-11 LAB — PREGNANCY, URINE: Preg Test, Ur: NEGATIVE

## 2021-01-11 NOTE — Progress Notes (Signed)
    Allison Wood 28-Nov-1970 [id number]        50 y.o.  G1P1L1  RP: ASCUS x 2  with HPV HR Neg for Colpo  HPI: ASCUS/HPV HR Neg 10/2020.  ASCUS as well in 09/2019.   OB History  Gravida Para Term Preterm AB Living  1 1       1   SAB IAB Ectopic Multiple Live Births               # Outcome Date GA Lbr Len/2nd Weight Sex Delivery Anes PTL Lv  1 Para             Past medical history,surgical history, problem list, medications, allergies, family history and social history were all reviewed and documented in the EPIC chart.   Directed ROS with pertinent positives and negatives documented in the history of present illness/assessment and plan.  Exam:  Vitals:   01/11/21 0820  BP: 104/64   General appearance:  Normal  UPT Neg  Colposcopy Procedure Note Teesha Halloran 01/11/2021  Indications:  ASCUS x 2 with HPV HR Neg  Procedure Details  The risks and benefits of the procedure and Written informed consent obtained.  Speculum placed in vagina and excellent visualization of cervix achieved, cervix swabbed x 3 with acetic acid solution.  Findings: Physical Exam Genitourinary:       Cervix colposcopy:  Vaginal colposcopy: Normal  Vulvar colposcopy: Normal  Perirectal colposcopy: Normal  The cervix was sprayed with [place] before performing the cervical biopsies.  Specimens: Cervical Bx at 4 O'Clock and Endocervical Polyp  Complications:  No Cx, good hemostasis with Silver Nitrate and Moncel . Plan:   Management per results    Assessment/Plan:  50 y.o. G1P1   1. ASCUS of cervix with negative high risk HPV  - Colposcopy - Surgical pathology( [place]/ [facility])  2. Cervical polyp - Surgical pathology( [place]/ [facility])  3. Encounter for preprocedural laboratory examination UPT Neg - Pregnancy, urine  Other orders - B Complex Vitamins (B COMPLEX PO); Take by mouth. - POTASSIUM PO; Take by mouth. - Multiple Vitamins-Minerals (ZINC PO);  Take by mouth.   [name] MD, 8:38 AM 01/11/2021

## 2021-01-16 LAB — SURGICAL PATHOLOGY

## 2021-03-29 ENCOUNTER — Other Ambulatory Visit: Payer: Self-pay | Admitting: Obstetrics & Gynecology

## 2021-03-29 DIAGNOSIS — Z1231 Encounter for screening mammogram for malignant neoplasm of breast: Secondary | ICD-10-CM

## 2021-03-30 ENCOUNTER — Ambulatory Visit
Admission: RE | Admit: 2021-03-30 | Discharge: 2021-03-30 | Disposition: A | Discharge status: Home / Self Care | Payer: BC Managed Care – PPO | Source: Ambulatory Visit | Attending: Obstetrics & Gynecology | Admitting: Obstetrics & Gynecology

## 2021-03-30 DIAGNOSIS — Z1231 Encounter for screening mammogram for malignant neoplasm of breast: Secondary | ICD-10-CM

## 2021-07-06 IMAGING — MG DIGITAL SCREENING BILAT W/ TOMO W/ CAD
8 series · 9 of 24 positions shown · non-contrast
Comparison: Previous exam(s).

CLINICAL DATA: Screening.

EXAM:
DIGITAL SCREENING BILATERAL MAMMOGRAM WITH TOMO AND CAD

[L MLO synth-2D]
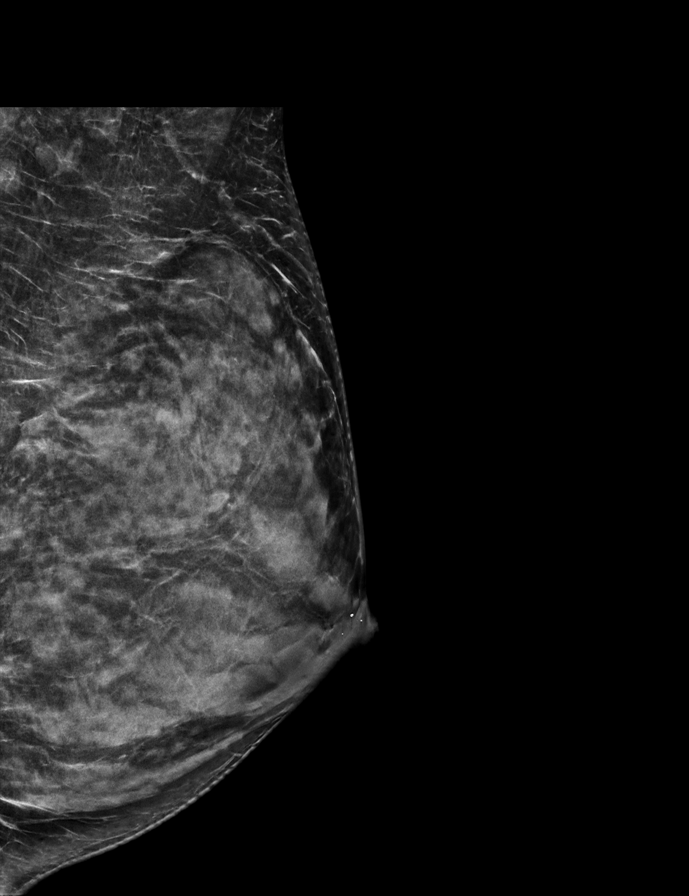

[R CC synth-2D]
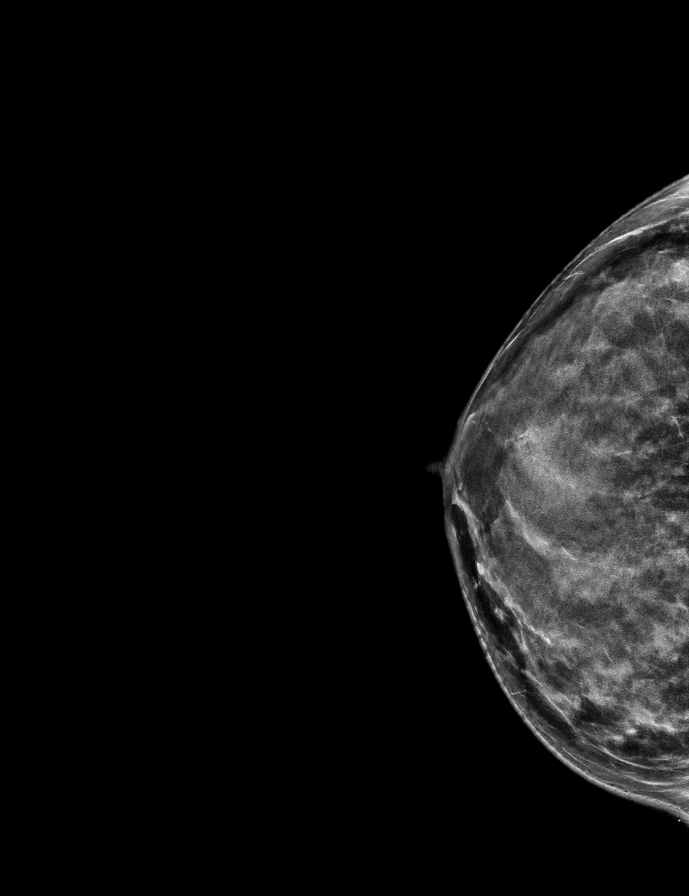

[L CC synth-2D]
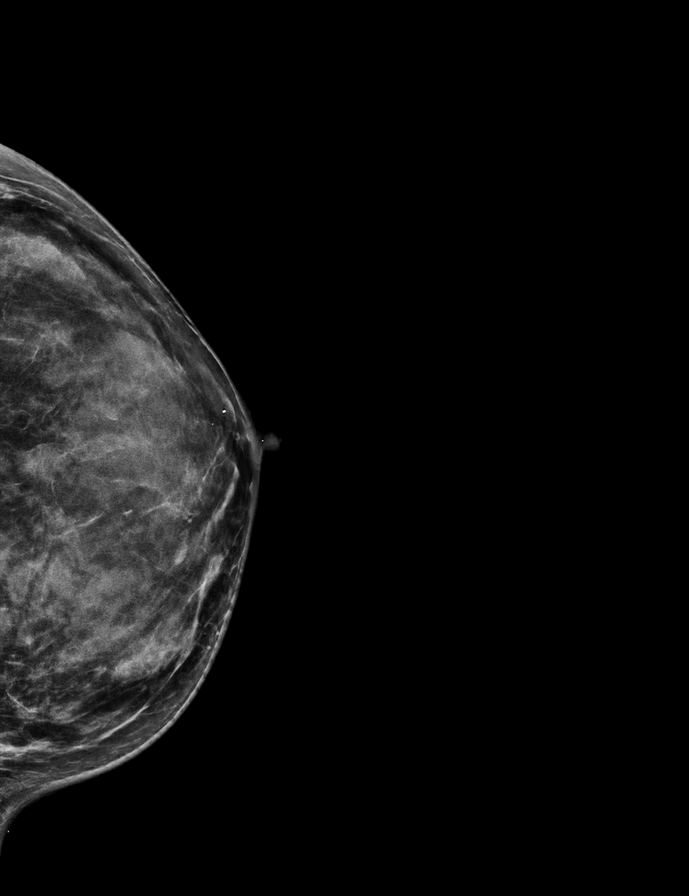

[R MLO synth-2D]
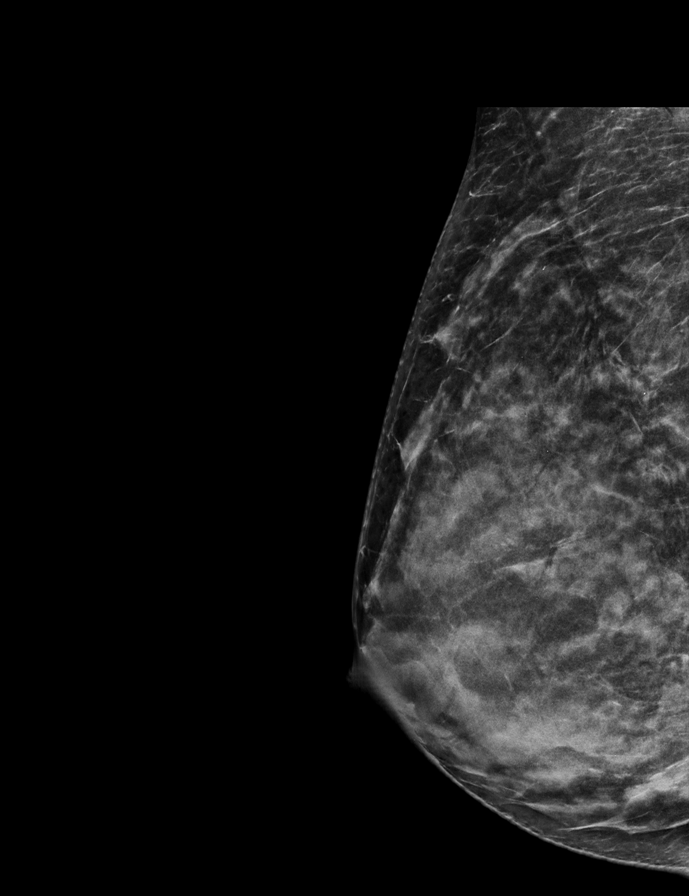

[R MLO tomo · 2 of 61 frames shown]
[frame 20/61]
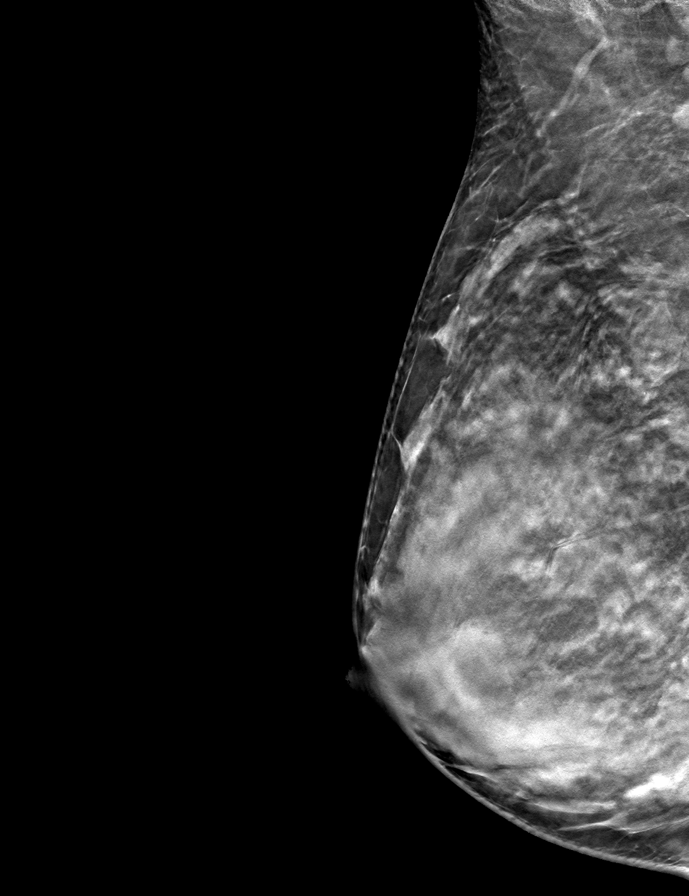
[frame 31/61]
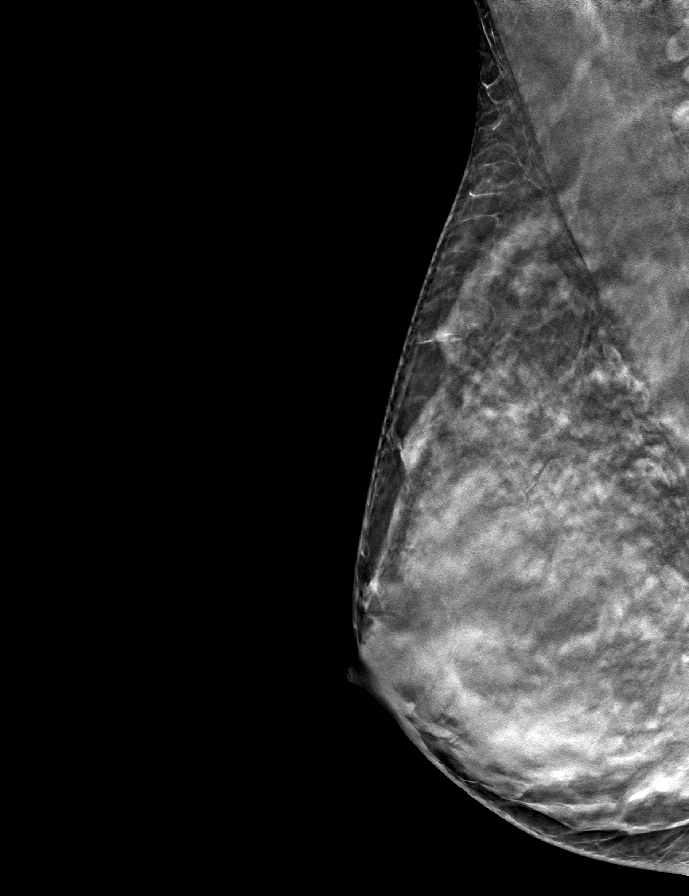

[L CC tomo · tomo slice 29/56.0]
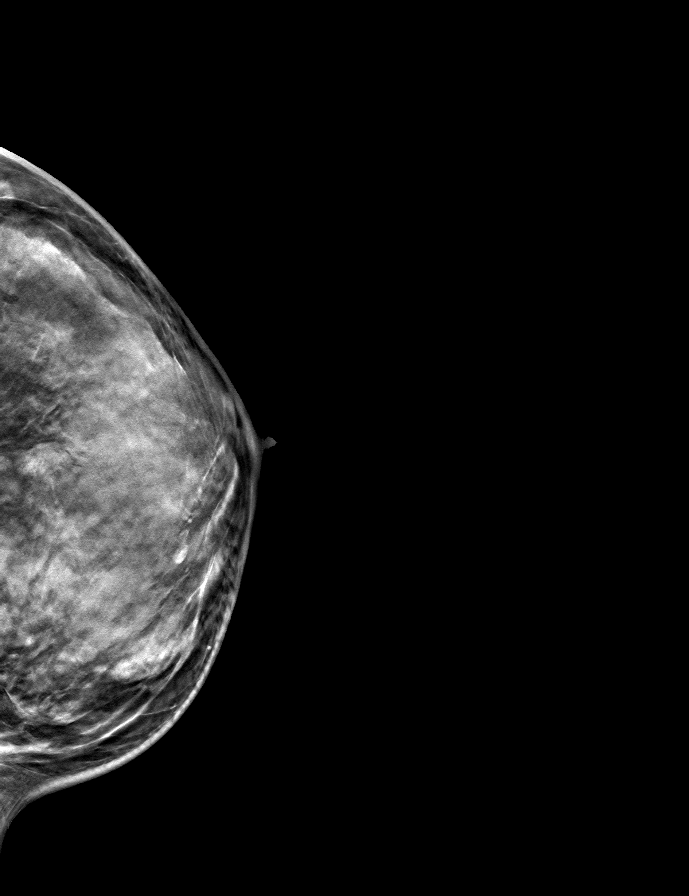

[L MLO tomo · tomo slice 32/63.0]
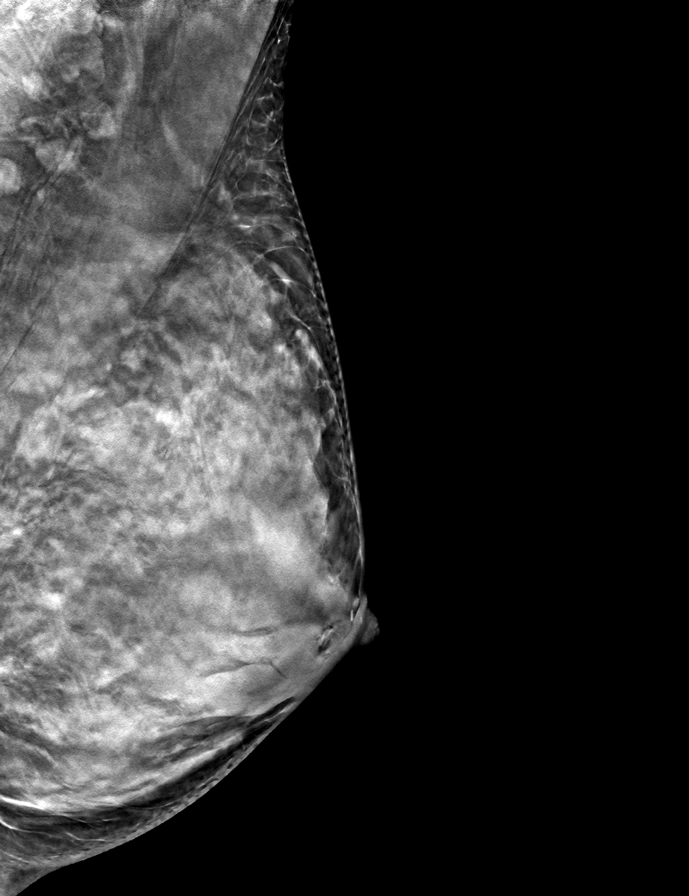

[R CC tomo · tomo slice 29/57.0]
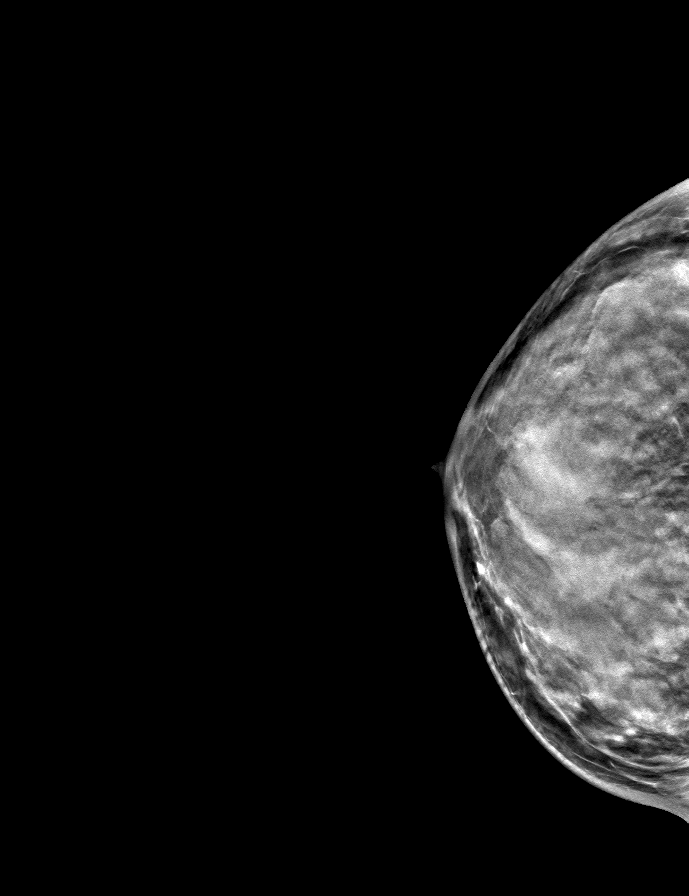

[9 of 24 positions shown; findings below may reference images not displayed]

ACR Breast Density Category d: The breast tissue is extremely dense,
which lowers the sensitivity of mammography.
FINDINGS: In the left breast, a possible asymmetry warrants further
evaluation. It is seen in the central breast at posterior depth on
the CC view only. In the right breast, no findings suspicious for
malignancy. Images were processed with CAD.
IMPRESSION: Further evaluation is suggested for possible asymmetry in the left
breast.

RECOMMENDATION:
Diagnostic mammogram and possibly ultrasound of the left breast.
(Code:[id number]-[id number])

The patient will be contacted regarding the findings, and additional
imaging will be scheduled.

BI-RADS CATEGORY  0: Incomplete. Need additional imaging evaluation
and/or prior mammograms for comparison.

## 2021-07-25 ENCOUNTER — Ambulatory Visit: Payer: BC Managed Care – PPO | Admitting: Obstetrics & Gynecology

## 2021-07-26 ENCOUNTER — Encounter: Payer: Self-pay | Admitting: Obstetrics & Gynecology

## 2021-07-26 ENCOUNTER — Ambulatory Visit: Payer: BC Managed Care – PPO | Admitting: Obstetrics & Gynecology

## 2021-07-26 ENCOUNTER — Other Ambulatory Visit (HOSPITAL_COMMUNITY)
Admission: RE | Admit: 2021-07-26 | Discharge: 2021-07-26 | Disposition: A | Discharge status: Home / Self Care | Payer: BC Managed Care – PPO | Source: Ambulatory Visit | Attending: Obstetrics & Gynecology | Admitting: Obstetrics & Gynecology

## 2021-07-26 VITALS — BP 120/70 | Wt 151.0 lb

## 2021-07-26 DIAGNOSIS — N870 Mild cervical dysplasia: Secondary | ICD-10-CM | POA: Diagnosis not present

## 2021-07-26 DIAGNOSIS — R87610 Atypical squamous cells of undetermined significance on cytologic smear of cervix (ASC-US): Secondary | ICD-10-CM

## 2021-07-26 DIAGNOSIS — N951 Menopausal and female climacteric states: Secondary | ICD-10-CM

## 2021-07-26 NOTE — Progress Notes (Signed)
? ? ?  Allison Wood [birth year] [id number] ? ? ?     51 y.o.  G1P1L1 Married ? ?RP: Repeat Pap at 6 months and Perimenopausal Sxs ? ?HPI: ASCUS x 2.  HPV HR Neg.  Colpo 12/2020 CIN 1.  Perimenopausal with Oligomenrrhea.  Menses are light every 3 months.  Hot flushes and night sweats.  Some joint pains.  No pelvic pain. No pain with IC. ? ? ?OB History  ?Gravida Para Term Preterm AB Living  ?'1 1       1  '$ ?SAB IAB Ectopic Multiple Live Births  ?           ?  ?# Outcome Date GA Lbr Len/2nd Weight Sex Delivery Anes PTL Lv  ?1 Para           ? ? ?Past medical history,surgical history, problem list, medications, allergies, family history and social history were all reviewed and documented in the EPIC chart. ? ? ?Directed ROS with pertinent positives and negatives documented in the history of present illness/assessment and plan. ? ?Exam: ? ?Vitals:  ? 07/26/21 0841  ?BP: 120/70  ?Weight: 151 lb (68.5 kg)  ? ?General appearance:  Normal ? ?Abdomen: Normal ? ?Gynecologic exam: Vulva normal.  Speculum:  Cervix/Vagina normal.  Pap reflex done. ? ? ?Assessment/Plan:  51 y.o. G1P1  ? ?1. ASCUS of cervix with negative high risk HPV ?ASCUS x 2.  HPV HR Neg.  Colpo 12/2020 CIN 1.  Pap reflex done today.  Management per results. ?- Cytology - PAP( [facility]) ? ?2. Dysplasia of cervix, low grade (CIN 1) ?- Cytology - PAP( [facility]) ? ?3. Perimenopause ?Perimenopausal with Oligomenrrhea.  Menses are light every 3 months.  Hot flushes and night sweats.  Some joint pains.  No pelvic pain. No pain with IC.  Counseling on perimenopause/menopause.  Management with HRT discussed.  Patient prefers to start with a vegetal estrogen at this time.  Recommend Ashwagandha supplement 1 tab PO daily.   ? ?Other orders ?- MAGNESIUM PO; Take by mouth.  ? ?[name] MD, 8:54 AM 07/26/2021 ? ? ? ?  ?

## 2021-08-01 LAB — CYTOLOGY - PAP
Diagnosis: NEGATIVE
Diagnosis: REACTIVE

## 2021-09-12 DIAGNOSIS — Z1211 Encounter for screening for malignant neoplasm of colon: Secondary | ICD-10-CM | POA: Diagnosis not present

## 2021-10-05 DIAGNOSIS — Z1211 Encounter for screening for malignant neoplasm of colon: Secondary | ICD-10-CM | POA: Diagnosis not present

## 2021-10-11 DIAGNOSIS — K635 Polyp of colon: Secondary | ICD-10-CM | POA: Diagnosis not present

## 2021-11-21 ENCOUNTER — Ambulatory Visit (INDEPENDENT_AMBULATORY_CARE_PROVIDER_SITE_OTHER): Payer: BC Managed Care – PPO | Admitting: Obstetrics & Gynecology

## 2021-11-21 ENCOUNTER — Encounter: Payer: Self-pay | Admitting: Obstetrics & Gynecology

## 2021-11-21 ENCOUNTER — Other Ambulatory Visit (HOSPITAL_COMMUNITY)
Admission: RE | Admit: 2021-11-21 | Discharge: 2021-11-21 | Disposition: A | Discharge status: Home / Self Care | Payer: BC Managed Care – PPO | Source: Ambulatory Visit | Attending: Obstetrics & Gynecology | Admitting: Obstetrics & Gynecology

## 2021-11-21 VITALS — BP 114/68 | HR 60 | Ht 67.0 in | Wt 155.0 lb

## 2021-11-21 DIAGNOSIS — N951 Menopausal and female climacteric states: Secondary | ICD-10-CM

## 2021-11-21 DIAGNOSIS — R87610 Atypical squamous cells of undetermined significance on cytologic smear of cervix (ASC-US): Secondary | ICD-10-CM

## 2021-11-21 DIAGNOSIS — Z01419 Encounter for gynecological examination (general) (routine) without abnormal findings: Secondary | ICD-10-CM | POA: Diagnosis not present

## 2021-11-21 NOTE — Progress Notes (Signed)
Allison Wood May 22, 1970 [id number]   History:    51 y.o. G1P1L1 Married.  Son is 55 yo, doing well.  From [place].  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   RP: Established patient preseting for annual gyn exam    HPI: S/P LPS Lt SO 12/15/2019.  Menses with normal to light flow every 3-5 months. No BTB.  No pelvic pain.  No pain with IC.  Pap ASCUS/HPV HR Neg 09/2019.  Colpo No dysplasia.  Pap Neg 06/2021.  Pap reflex today. Unlikely to conceive, declines contraception, but okay if does conceive.  Breasts normal.  Screening mammo Neg 03/2021.  BMI 24.28.  Fasting Health Labs with Fam MD.  Harriet Masson 09/2021.  Head tremor, will evaluate through Fam MD, Neuro referral recommended.    Past medical history,surgical history, family history and social history were all reviewed and documented in the EPIC chart.  Gynecologic History Patient's last menstrual period was 11/08/2021 (exact date).  Obstetric History OB History  Gravida Para Term Preterm AB Living  '1 1   1   1  '$ SAB IAB Ectopic Multiple Live Births               # Outcome Date GA Lbr Len/2nd Weight Sex Delivery Anes PTL Lv  1 Preterm              ROS: A ROS was performed and pertinent positives and negatives are included in the history.  GENERAL: No fevers or chills. HEENT: No change in vision, no earache, sore throat or sinus congestion. NECK: No pain or stiffness. CARDIOVASCULAR: No chest pain or pressure. No palpitations. PULMONARY: No shortness of breath, cough or wheeze. GASTROINTESTINAL: No abdominal pain, nausea, vomiting or diarrhea, melena or bright red blood per rectum. GENITOURINARY: No urinary frequency, urgency, hesitancy or dysuria. MUSCULOSKELETAL: No joint or muscle pain, no back pain, no recent trauma. DERMATOLOGIC: No rash, no itching, no lesions. ENDOCRINE: No polyuria, polydipsia, no heat or cold intolerance. No recent change in weight. HEMATOLOGICAL: No anemia or easy bruising or bleeding. NEUROLOGIC:  No headache, seizures, numbness, tingling or weakness. PSYCHIATRIC: No depression, no loss of interest in normal activity or change in sleep pattern.     Exam:   BP 114/68   Pulse 60   Ht '5\' 7"'$  (1.702 m)   Wt 155 lb (70.3 kg)   LMP 11/08/2021 (Exact Date)   SpO2 99%   BMI 24.28 kg/m   Body mass index is 24.28 kg/m.  General appearance : Well developed well nourished female. No acute distress HEENT: Eyes: no retinal hemorrhage or exudates,  Neck supple, trachea midline, no carotid bruits, no thyroidmegaly Lungs: Clear to auscultation, no rhonchi or wheezes, or rib retractions  Heart: Regular rate and rhythm, no murmurs or gallops Breast:Examined in sitting and supine position were symmetrical in appearance, no palpable masses or tenderness,  no skin retraction, no nipple inversion, no nipple discharge, no skin discoloration, no axillary or supraclavicular lymphadenopathy Abdomen: no palpable masses or tenderness, no rebound or guarding Extremities: no edema or skin discoloration or tenderness  Pelvic: Vulva: Normal             Vagina: No gross lesions or discharge  Cervix: No gross lesions or discharge.  Pap reflex done.  Uterus  AV, normal size, shape and consistency, non-tender and mobile  Adnexa  Without masses or tenderness  Anus: Normal   Assessment/Plan:  51 y.o. female for annual exam   1. Encounter  for routine gynecological examination with Papanicolaou smear of cervix S/P LPS Lt SO 12/15/2019.  Menses with normal to light flow every 3-5 months. No BTB.  No pelvic pain.  No pain with IC.  Pap ASCUS/HPV HR Neg 09/2019.  Colpo No dysplasia.  Pap Neg 06/2021.  Pap reflex today. Unlikely to conceive, declines contraception, but okay if does conceive.  Breasts normal.  Screening mammo Neg 03/2021.  BMI 24.28.  Fasting Health Labs with Fam MD.  Harriet Masson 09/2021.  Head tremor, will evaluate through Fam MD, Neuro referral recommended. - Cytology - PAP( [facility])  2. ASCUS of  cervix with negative high risk HPV - Cytology - PAP( [facility])  3. Perimenopause S/P LPS Lt SO 12/15/2019.  Menses with normal to light flow every 3-5 months. No BTB.  No pelvic pain.  No pain with IC.   Other orders - COLLAGEN PO; Take by mouth. - Omega-3 Fatty Acids (OMEGA 3 PO); Take by mouth. - POTASSIUM PO; Take by mouth.   [name] MD, 9:42 AM 11/21/2021

## 2021-11-28 LAB — CYTOLOGY - PAP: Diagnosis: NEGATIVE

## 2022-05-22 DIAGNOSIS — L720 Epidermal cyst: Secondary | ICD-10-CM | POA: Diagnosis not present

## 2023-05-29 ENCOUNTER — Encounter: Payer: Self-pay | Admitting: Obstetrics and Gynecology

## 2023-05-29 ENCOUNTER — Other Ambulatory Visit (HOSPITAL_COMMUNITY)
Admission: RE | Admit: 2023-05-29 | Discharge: 2023-05-29 | Disposition: A | Discharge status: Home / Self Care | Source: Ambulatory Visit | Attending: Obstetrics and Gynecology | Admitting: Obstetrics and Gynecology

## 2023-05-29 ENCOUNTER — Ambulatory Visit (INDEPENDENT_AMBULATORY_CARE_PROVIDER_SITE_OTHER): Payer: BC Managed Care – PPO | Admitting: Obstetrics and Gynecology

## 2023-05-29 VITALS — BP 120/78 | HR 51 | Ht 67.0 in | Wt 155.0 lb

## 2023-05-29 DIAGNOSIS — R61 Generalized hyperhidrosis: Secondary | ICD-10-CM | POA: Diagnosis not present

## 2023-05-29 DIAGNOSIS — E2839 Other primary ovarian failure: Secondary | ICD-10-CM

## 2023-05-29 DIAGNOSIS — Z01419 Encounter for gynecological examination (general) (routine) without abnormal findings: Secondary | ICD-10-CM | POA: Insufficient documentation

## 2023-05-29 DIAGNOSIS — D649 Anemia, unspecified: Secondary | ICD-10-CM | POA: Diagnosis not present

## 2023-05-29 DIAGNOSIS — Z1331 Encounter for screening for depression: Secondary | ICD-10-CM

## 2023-05-29 DIAGNOSIS — N951 Menopausal and female climacteric states: Secondary | ICD-10-CM | POA: Diagnosis not present

## 2023-05-29 DIAGNOSIS — N63 Unspecified lump in unspecified breast: Secondary | ICD-10-CM

## 2023-05-29 DIAGNOSIS — Z1231 Encounter for screening mammogram for malignant neoplasm of breast: Secondary | ICD-10-CM

## 2023-05-29 DIAGNOSIS — E509 Vitamin A deficiency, unspecified: Secondary | ICD-10-CM | POA: Diagnosis not present

## 2023-05-29 LAB — VITAMIN D 25 HYDROXY (VIT D DEFICIENCY, FRACTURES): Vit D, 25-Hydroxy: 52 ng/mL (ref 30–100)

## 2023-05-29 LAB — ESTRADIOL: Estradiol: <15 pg/mL

## 2023-05-29 LAB — FOLLICLE STIMULATING HORMONE: FSH: 104.6 m[IU]/mL

## 2023-05-29 MED ORDER — INTRAROSA 6.5 MG VA INST: 1.0000 | VAGINAL_INSERT | Freq: Every evening | VAGINAL | 12 refills | Status: AC | PRN

## 2023-05-29 NOTE — Progress Notes (Signed)
 53 y.o. y.o. female here for annual exam. No LMP recorded (lmp unknown). (Menstrual status: Irregular Periods).    G1P1L1 Married.  Son is 18 yo, doing well.  From [place]  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   RP: Established patient preseting for annual gyn exam    HPI: S/P LPS Lt SO 12/15/2019.  No HRT. Menses in Nov 2024 and then one in Jan 2025 that was light. Reports bothersome hot flashes and insomnia.  No pelvic pain.  No pain with IC.  Pap ASCUS/HPV HR Neg 09/2019.  Colpo No dysplasia.  Pap Neg 06/2021.  Pap reflex today.  Breasts normal.  Screening mammo Neg 03/2021 scheduled 06/11/23.  BMI 24.28.  Fasting Health Labs with Fam MD.  Allison Wood 09/2021.  Head tremor, will evaluate through Fam MD, Neuro referral recommended. Dxa: mother with osteopenia. To get baseline   Body mass index is 24.28 kg/m.     05/29/2023    8:03 AM  Depression screen PHQ 2/9  Decreased Interest 1  Down, Depressed, Hopeless 0  PHQ - 2 Score 1    Blood pressure 120/78, pulse (!) 51, height 5\' 7"  (1.702 m), weight 155 lb (70.3 kg), SpO2 95%.     Component Value Date/Time   DIAGPAP  11/21/2021 1000    - Negative for intraepithelial lesion or malignancy (NILM)   DIAGPAP  07/26/2021 0908    - Negative for Intraepithelial Lesions or Malignancy (NILM)   DIAGPAP - Benign reactive/reparative changes 07/26/2021 0908   HPVHIGH Negative 11/17/2020 1412   ADEQPAP  11/21/2021 1000    Satisfactory for evaluation; transformation zone component PRESENT.   ADEQPAP  07/26/2021 0908    Satisfactory for evaluation; transformation zone component PRESENT.   ADEQPAP  11/17/2020 1412    Satisfactory for evaluation; transformation zone component PRESENT.    GYN HISTORY:    Component Value Date/Time   DIAGPAP  11/21/2021 1000    - Negative for intraepithelial lesion or malignancy (NILM)   DIAGPAP  07/26/2021 0908    - Negative for Intraepithelial Lesions or Malignancy (NILM)   DIAGPAP - Benign  reactive/reparative changes 07/26/2021 0908   HPVHIGH Negative 11/17/2020 1412   ADEQPAP  11/21/2021 1000    Satisfactory for evaluation; transformation zone component PRESENT.   ADEQPAP  07/26/2021 0908    Satisfactory for evaluation; transformation zone component PRESENT.   ADEQPAP  11/17/2020 1412    Satisfactory for evaluation; transformation zone component PRESENT.    OB History  Gravida Para Term Preterm AB Living  1 1  1  1   SAB IAB Ectopic Multiple Live Births          # Outcome Date GA Lbr Len/2nd Weight Sex Type Anes PTL Lv  1 Preterm             Past Medical History:  Diagnosis Date  . Anemia 2016   none since  . Hydrosalpinx   . Vitamin A deficiency     Past Surgical History:  Procedure Laterality Date  . LAPAROSCOPIC UNILATERAL SALPINGECTOMY Left 12/15/2019   Procedure: LAPAROSCOPIC; LEFT SALPINGOOPHORECTOMY, LEFT OVARIAN CYSTECTOMY, EXTENSIVE LYSIS OF ADHESIONS AND PERITONEAL WASHINGS;  Surgeon: Genia Del, MD;  Location: [facility] [place];  Service: Gynecology;  Laterality: Left;  . NO PAST SURGERIES      Current Outpatient Medications on File Prior to Visit  Medication Sig Dispense Refill  . B Complex Vitamins (B COMPLEX PO) Take by mouth.    . COLLAGEN  PO Take by mouth.    Marland Kitchen MAGNESIUM PO Take by mouth.    . Omega-3 Fatty Acids (OMEGA 3 PO) Take by mouth.    Marland Kitchen POTASSIUM PO Take by mouth.     No current facility-administered medications on file prior to visit.    Social History   Socioeconomic History  . Marital status: Married    Spouse name: Not on file  . Number of children: Not on file  . Years of education: Not on file  . Highest education level: Not on file  Occupational History  . Not on file  Tobacco Use  . Smoking status: Never  . Smokeless tobacco: Never  Vaping Use  . Vaping status: Never Used  Substance and Sexual Activity  . Alcohol use: Not Currently  . Drug use: Never  . Sexual activity: Yes    Partners:  Male    Birth control/protection: None    Comment: 1st intercourse- 31  Other Topics Concern  . Not on file  Social History Narrative  . Not on file   Social Drivers of Health   Financial Resource Strain: Not on file  Food Insecurity: Not on file  Transportation Needs: Not on file  Physical Activity: Not on file  Stress: Not on file  Social Connections: Not on file  Intimate Partner Violence: Not on file    Family History  Problem Relation Age of Onset  . Diabetes Father   . Cancer Father        lung      No Known Allergies    Patient's last menstrual period was No LMP recorded (lmp unknown). (Menstrual status: Irregular Periods)..            Review of Systems Alls systems reviewed and are negative.     Physical Exam Constitutional:      Appearance: Normal appearance.  Genitourinary:     Vulva and urethral meatus normal.     No lesions in the vagina.     Right Labia: No rash, lesions or skin changes.    Left Labia: No lesions, skin changes or rash.    No vaginal discharge or tenderness.     No vaginal prolapse present.    No vaginal atrophy present.     Right Adnexa: not tender, not palpable and no mass present.    Left Adnexa: not tender, not palpable and no mass present.    No cervical motion tenderness or discharge.     Uterus is not enlarged, tender or irregular.  Breasts:    Right: Normal.     Left: Normal.  HENT:     Head: Normocephalic.  Neck:     Thyroid: No thyroid mass, thyromegaly or thyroid tenderness.  Cardiovascular:     Rate and Rhythm: Normal rate and regular rhythm.     Heart sounds: Normal heart sounds, S1 normal and S2 normal.  Pulmonary:     Effort: Pulmonary effort is normal.     Breath sounds: Normal breath sounds and air entry.  Abdominal:     General: There is no distension.     Palpations: Abdomen is soft. There is no mass.     Tenderness: There is no abdominal tenderness. There is no guarding or rebound.   Musculoskeletal:        General: Normal range of motion.     Cervical back: Full passive range of motion without pain, normal range of motion and neck supple. No tenderness.     Right  lower leg: No edema.     Left lower leg: No edema.  Neurological:     Mental Status: She is alert.  Skin:    General: Skin is warm.  Psychiatric:        Mood and Affect: Mood normal.        Behavior: Behavior normal.        Thought Content: Thought content normal.  Vitals and nursing note reviewed. Exam conducted with a chaperone present.      A:         Well Woman GYN exam                             P:        Pap smear collected today Encouraged annual mammogram screening Colon cancer screening up-to-date DXA ordered today Labs and immunizations to do with PMD To get hormone panel Encouraged healthy lifestyle practices Encouraged Vit D and Calcium   No follow-ups on file.  Earley Favor

## 2023-06-04 LAB — CYTOLOGY - PAP
Comment: NEGATIVE
Diagnosis: UNDETERMINED — AB
High risk HPV: NEGATIVE

## 2023-06-11 ENCOUNTER — Ambulatory Visit
Admission: RE | Admit: 2023-06-11 | Discharge: 2023-06-11 | Disposition: A | Discharge status: Home / Self Care | Payer: BC Managed Care – PPO | Source: Ambulatory Visit | Attending: Obstetrics and Gynecology

## 2023-06-11 DIAGNOSIS — Z01419 Encounter for gynecological examination (general) (routine) without abnormal findings: Secondary | ICD-10-CM

## 2023-06-11 DIAGNOSIS — Z1231 Encounter for screening mammogram for malignant neoplasm of breast: Secondary | ICD-10-CM | POA: Diagnosis not present

## 2023-06-23 DIAGNOSIS — D1722 Benign lipomatous neoplasm of skin and subcutaneous tissue of left arm: Secondary | ICD-10-CM | POA: Diagnosis not present

## 2023-07-14 DIAGNOSIS — D1722 Benign lipomatous neoplasm of skin and subcutaneous tissue of left arm: Secondary | ICD-10-CM | POA: Diagnosis not present

## 2024-06-03 ENCOUNTER — Ambulatory Visit: Payer: BC Managed Care – PPO | Admitting: Obstetrics and Gynecology
